# Patient Record
Sex: Female | Born: 2000 | Race: Black or African American | Hispanic: No | Marital: Single | Smoking: Never smoker
Health system: Southern US, Community
[De-identification: ages and names within clinical notes are randomized; demographics above are authoritative.]

## PROBLEM LIST (undated history)

## (undated) DIAGNOSIS — J45909 Unspecified asthma, uncomplicated: Secondary | ICD-10-CM

## (undated) HISTORY — PX: TONSILLECTOMY: SUR1361

## (undated) HISTORY — DX: Unspecified asthma, uncomplicated: J45.909

---
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
## 2020-04-16 ENCOUNTER — Encounter (HOSPITAL_COMMUNITY): Payer: Self-pay

## 2020-04-16 ENCOUNTER — Other Ambulatory Visit: Payer: Self-pay

## 2020-04-16 ENCOUNTER — Inpatient Hospital Stay (HOSPITAL_COMMUNITY)
Admission: EM | Admit: 2020-04-16 | Discharge: 2020-04-18 | DRG: 392 | Disposition: A | Discharge status: Home / Self Care | Payer: 59 | Attending: Student | Admitting: Student

## 2020-04-16 ENCOUNTER — Emergency Department (HOSPITAL_COMMUNITY): Payer: 59

## 2020-04-16 DIAGNOSIS — A084 Viral intestinal infection, unspecified: Secondary | ICD-10-CM | POA: Diagnosis present

## 2020-04-16 DIAGNOSIS — Z20822 Contact with and (suspected) exposure to covid-19: Secondary | ICD-10-CM | POA: Diagnosis present

## 2020-04-16 DIAGNOSIS — E872 Acidosis: Secondary | ICD-10-CM | POA: Diagnosis present

## 2020-04-16 DIAGNOSIS — E860 Dehydration: Secondary | ICD-10-CM | POA: Diagnosis present

## 2020-04-16 DIAGNOSIS — Z6836 Body mass index (BMI) 36.0-36.9, adult: Secondary | ICD-10-CM | POA: Diagnosis not present

## 2020-04-16 DIAGNOSIS — E871 Hypo-osmolality and hyponatremia: Secondary | ICD-10-CM | POA: Diagnosis present

## 2020-04-16 DIAGNOSIS — K521 Toxic gastroenteritis and colitis: Secondary | ICD-10-CM | POA: Diagnosis present

## 2020-04-16 DIAGNOSIS — I959 Hypotension, unspecified: Secondary | ICD-10-CM | POA: Diagnosis present

## 2020-04-16 DIAGNOSIS — K529 Noninfective gastroenteritis and colitis, unspecified: Secondary | ICD-10-CM | POA: Diagnosis not present

## 2020-04-16 DIAGNOSIS — N179 Acute kidney failure, unspecified: Secondary | ICD-10-CM | POA: Diagnosis present

## 2020-04-16 DIAGNOSIS — E669 Obesity, unspecified: Secondary | ICD-10-CM | POA: Diagnosis present

## 2020-04-16 DIAGNOSIS — J45909 Unspecified asthma, uncomplicated: Secondary | ICD-10-CM | POA: Diagnosis present

## 2020-04-16 DIAGNOSIS — E876 Hypokalemia: Secondary | ICD-10-CM | POA: Diagnosis present

## 2020-04-16 DIAGNOSIS — E861 Hypovolemia: Secondary | ICD-10-CM | POA: Diagnosis present

## 2020-04-16 DIAGNOSIS — R7989 Other specified abnormal findings of blood chemistry: Secondary | ICD-10-CM | POA: Diagnosis not present

## 2020-04-16 LAB — COMPREHENSIVE METABOLIC PANEL
ALT: 21 U/L (ref 0–44)
AST: 25 U/L (ref 15–41)
Albumin: 3.4 g/dL — ABNORMAL LOW (ref 3.5–5.0)
Alkaline Phosphatase: 87 U/L (ref 38–126)
Anion gap: 17 — ABNORMAL HIGH (ref 5–15)
BUN: 62 mg/dL — ABNORMAL HIGH (ref 6–20)
CO2: 17 mmol/L — ABNORMAL LOW (ref 22–32)
Calcium: 8.6 mg/dL — ABNORMAL LOW (ref 8.9–10.3)
Chloride: 98 mmol/L (ref 98–111)
Creatinine, Ser: 4.18 mg/dL — ABNORMAL HIGH (ref 0.44–1.00)
GFR, Estimated: 15 mL/min — ABNORMAL LOW (ref >60)
Glucose, Bld: 127 mg/dL — ABNORMAL HIGH (ref 70–99)
Potassium: 3 mmol/L — ABNORMAL LOW (ref 3.5–5.1)
Sodium: 132 mmol/L — ABNORMAL LOW (ref 135–145)
Total Bilirubin: 0.9 mg/dL (ref 0.3–1.2)
Total Protein: 8.6 g/dL — ABNORMAL HIGH (ref 6.5–8.1)

## 2020-04-16 LAB — CBC WITH DIFFERENTIAL/PLATELET
Abs Immature Granulocytes: 0.16 10*3/uL — ABNORMAL HIGH (ref 0.00–0.07)
Basophils Absolute: 0.1 10*3/uL (ref 0.0–0.1)
Basophils Relative: 1 %
Eosinophils Absolute: 0.1 10*3/uL (ref 0.0–0.5)
Eosinophils Relative: 1 %
HCT: 40.6 % (ref 36.0–46.0)
Hemoglobin: 13.4 g/dL (ref 12.0–15.0)
Immature Granulocytes: 1 %
Lymphocytes Relative: 3 %
Lymphs Abs: 0.6 10*3/uL — ABNORMAL LOW (ref 0.7–4.0)
MCH: 24.1 pg — ABNORMAL LOW (ref 26.0–34.0)
MCHC: 33 g/dL (ref 30.0–36.0)
MCV: 73.2 fL — ABNORMAL LOW (ref 80.0–100.0)
Monocytes Absolute: 0.6 10*3/uL (ref 0.1–1.0)
Monocytes Relative: 3 %
Neutro Abs: 21.9 10*3/uL — ABNORMAL HIGH (ref 1.7–7.7)
Neutrophils Relative %: 91 %
Platelets: 316 10*3/uL (ref 150–400)
RBC: 5.55 MIL/uL — ABNORMAL HIGH (ref 3.87–5.11)
RDW: 14.6 % (ref 11.5–15.5)
WBC: 23.5 10*3/uL — ABNORMAL HIGH (ref 4.0–10.5)
nRBC: 0 % (ref 0.0–0.2)

## 2020-04-16 LAB — LIPASE, BLOOD: Lipase: 34 U/L (ref 11–51)

## 2020-04-16 LAB — RESP PANEL BY RT-PCR (FLU A&B, COVID) ARPGX2
Influenza A by PCR: NEGATIVE
Influenza B by PCR: NEGATIVE
SARS Coronavirus 2 by RT PCR: NEGATIVE

## 2020-04-16 LAB — MAGNESIUM: Magnesium: 1.9 mg/dL (ref 1.7–2.4)

## 2020-04-16 LAB — LACTIC ACID, PLASMA: Lactic Acid, Venous: 1.6 mmol/L (ref 0.5–1.9)

## 2020-04-16 LAB — I-STAT BETA HCG BLOOD, ED (MC, WL, AP ONLY): I-stat hCG, quantitative: <5 m[IU]/mL (ref <5)

## 2020-04-16 LAB — CBG MONITORING, ED: Glucose-Capillary: 127 mg/dL — ABNORMAL HIGH (ref 70–99)

## 2020-04-16 MED ORDER — MORPHINE SULFATE (PF) 4 MG/ML IV SOLN
4.0000 mg | Freq: Once | INTRAVENOUS | Status: AC
  Administered 2020-04-16: 4 mg via INTRAVENOUS
  Filled 2020-04-16: qty 1

## 2020-04-16 MED ORDER — ACETAMINOPHEN 325 MG PO TABS
650.0000 mg | ORAL_TABLET | Freq: Once | ORAL | Status: AC
  Administered 2020-04-16: 650 mg via ORAL
  Filled 2020-04-16: qty 2

## 2020-04-16 MED ORDER — POTASSIUM CHLORIDE 2 MEQ/ML IV SOLN
INTRAVENOUS | Status: DC
  Filled 2020-04-16 (×6): qty 1000

## 2020-04-16 MED ORDER — LACTATED RINGERS IV SOLN: INTRAVENOUS | Status: AC

## 2020-04-16 MED ORDER — HYDROCODONE-ACETAMINOPHEN 5-325 MG PO TABS
1.0000 | ORAL_TABLET | Freq: Four times a day (QID) | ORAL | Status: DC | PRN
  Administered 2020-04-17 – 2020-04-18 (×3): 1 via ORAL
  Filled 2020-04-16 (×3): qty 1

## 2020-04-16 MED ORDER — LACTATED RINGERS IV SOLN: INTRAVENOUS | Status: DC

## 2020-04-16 MED ORDER — LACTATED RINGERS IV BOLUS (SEPSIS)
1000.0000 mL | Freq: Once | INTRAVENOUS | Status: AC
  Administered 2020-04-16: 1000 mL via INTRAVENOUS

## 2020-04-16 MED ORDER — ONDANSETRON HCL 4 MG/2ML IJ SOLN
4.0000 mg | Freq: Four times a day (QID) | INTRAMUSCULAR | Status: DC | PRN
  Filled 2020-04-16: qty 2

## 2020-04-16 MED ORDER — LACTATED RINGERS IV BOLUS (SEPSIS)
600.0000 mL | Freq: Once | INTRAVENOUS | Status: AC
  Administered 2020-04-16: 600 mL via INTRAVENOUS

## 2020-04-16 MED ORDER — ACETAMINOPHEN 650 MG RE SUPP: 650.0000 mg | Freq: Four times a day (QID) | RECTAL | Status: DC | PRN

## 2020-04-16 MED ORDER — ONDANSETRON HCL 4 MG PO TABS: 4.0000 mg | ORAL_TABLET | Freq: Four times a day (QID) | ORAL | Status: DC | PRN

## 2020-04-16 MED ORDER — SODIUM CHLORIDE 0.9 % IV SOLN
2.0000 g | Freq: Once | INTRAVENOUS | Status: AC
  Administered 2020-04-16: 2 g via INTRAVENOUS
  Filled 2020-04-16: qty 2

## 2020-04-16 MED ORDER — ONDANSETRON HCL 4 MG/2ML IJ SOLN
4.0000 mg | Freq: Once | INTRAMUSCULAR | Status: AC
  Administered 2020-04-16: 4 mg via INTRAVENOUS
  Filled 2020-04-16: qty 2

## 2020-04-16 MED ORDER — POTASSIUM CHLORIDE 10 MEQ/100ML IV SOLN
10.0000 meq | Freq: Once | INTRAVENOUS | Status: AC
  Administered 2020-04-16: 10 meq via INTRAVENOUS
  Filled 2020-04-16: qty 100

## 2020-04-16 MED ORDER — MORPHINE SULFATE (PF) 2 MG/ML IV SOLN: 1.0000 mg | INTRAVENOUS | Status: DC | PRN

## 2020-04-16 MED ORDER — LACTATED RINGERS IV BOLUS
2000.0000 mL | Freq: Once | INTRAVENOUS | Status: AC
  Administered 2020-04-16: 2000 mL via INTRAVENOUS

## 2020-04-16 MED ORDER — METRONIDAZOLE IN NACL 5-0.79 MG/ML-% IV SOLN
500.0000 mg | Freq: Once | INTRAVENOUS | Status: AC
  Administered 2020-04-16: 500 mg via INTRAVENOUS
  Filled 2020-04-16: qty 100

## 2020-04-16 MED ORDER — ACETAMINOPHEN 325 MG PO TABS: 650.0000 mg | ORAL_TABLET | Freq: Four times a day (QID) | ORAL | Status: DC | PRN

## 2020-04-16 NOTE — ED Triage Notes (Signed)
Pt brought in for n/v/d & abd pain x 5 days. States she ate something from [employer] on Thursday and s/s started after. Pt was tachy in 120s for EMS, when pt arrived and stood up to get in chair HR went up to 160s.

## 2020-04-16 NOTE — ED Notes (Signed)
Secure message sent to [facility]

## 2020-04-16 NOTE — ED Notes (Signed)
Called transport to take patient upstiars 

## 2020-04-16 NOTE — Progress Notes (Signed)
A consult was received from an ED physician for cefepime per pharmacy dosing (for an indication other than meningitis). The patient's profile has been reviewed for ht/wt/allergies/indication/available labs. A one time order has been placed for the above antibiotics.  Further antibiotics/pharmacy consults should be ordered by admitting physician if indicated.                       Bernadene Person, PharmD, BCPS [phone] 04/16/2020, 3:44 PM

## 2020-04-16 NOTE — ED Provider Notes (Signed)
[facility]-EMERGENCY DEPT Provider Note   CSN: [id number] Arrival date & time: 04/16/20  1345     History Chief Complaint  Patient presents with  . Emesis    Abigail Grant is a 20 y.o. female.  20 year old female presents with several days of nausea vomiting diarrhea.  Patient states that she thinks she ate bad food.  Has had temperature times several days.  Had abdominal cramping which is worse in her lower quadrants.  Denies any vaginal bleeding or discharge.  Slight cough without congestion.  Has tried over-the-counter medications without relief.        Past Medical History:  Diagnosis Date  . Asthma     Patient Active Problem List   Diagnosis Date Noted  . Asthma 12/08/2011  . AR (allergic rhinitis) 12/08/2011    Past Surgical History:  Procedure Laterality Date  . TONSILLECTOMY       OB History   No obstetric history on file.     No family history on file.  Social History   Tobacco Use  . Smoking status: Never Smoker  . Smokeless tobacco: Never Used  Vaping Use  . Vaping Use: Never used  Substance Use Topics  . Alcohol use: Yes    Comment: occ  . Drug use: No    Home Medications Prior to Admission medications   Medication Sig Start Date End Date Taking? Authorizing Provider  Misc Natural Products (SUPER GREENS) POWD Take 1 Scoop by mouth daily.   Yes [provider]  Multiple Vitamins-Minerals (MULTIPLE VITAMINS/WOMENS PO) Take 1 tablet by mouth daily.   Yes [provider]    Allergies    Patient has no known allergies.  Review of Systems   Review of Systems  All other systems reviewed and are negative.   Physical Exam Updated Vital Signs BP 104/89 (BP Location: Left Arm)   Pulse (!) 154   Temp (!) 101.3 F (38.5 C) (Oral)   Resp (!) 30   Ht 1.575 m (5\' 2" )   Wt 90.7 kg   LMP 04/02/2020   SpO2 98%   BMI 36.58 kg/m   Physical Exam Vitals and nursing note reviewed.  Constitutional:       General: She is not in acute distress.    Appearance: Normal appearance. She is well-developed. She is not toxic-appearing.  HENT:     Head: Normocephalic and atraumatic.  Eyes:     General: Lids are normal.     Conjunctiva/sclera: Conjunctivae normal.     Pupils: Pupils are equal, round, and reactive to light.  Neck:     Thyroid: No thyroid mass.     Trachea: No tracheal deviation.  Cardiovascular:     Rate and Rhythm: Regular rhythm. Tachycardia present.     Heart sounds: Normal heart sounds. No murmur heard. No gallop.   Pulmonary:     Effort: Pulmonary effort is normal. No respiratory distress.     Breath sounds: Normal breath sounds. No stridor. No decreased breath sounds, wheezing, rhonchi or rales.  Abdominal:     General: Bowel sounds are normal. There is no distension.     Palpations: Abdomen is soft.     Tenderness: There is abdominal tenderness in the right lower quadrant and left lower quadrant. There is guarding. There is no rebound.    Musculoskeletal:        General: No tenderness. Normal range of motion.     Cervical back: Normal range of motion and neck  supple.  Skin:    General: Skin is warm and dry.     Findings: No abrasion or rash.  Neurological:     Mental Status: She is alert and oriented to person, place, and time.     GCS: GCS eye subscore is 4. GCS verbal subscore is 5. GCS motor subscore is 6.     Cranial Nerves: No cranial nerve deficit.     Sensory: No sensory deficit.  Psychiatric:        Speech: Speech normal.        Behavior: Behavior normal.     ED Results / Procedures / Treatments   Labs (all labs ordered are listed, but only abnormal results are displayed) Labs Reviewed  CBG MONITORING, ED - Abnormal; Notable for the following components:      Result Value   Glucose-Capillary 127 (*)    All other components within normal limits  RESP PANEL BY RT-PCR (FLU A&B, COVID) ARPGX2  CULTURE, BLOOD (ROUTINE X 2)  CULTURE, BLOOD (ROUTINE  X 2)  CBC WITH DIFFERENTIAL/PLATELET  COMPREHENSIVE METABOLIC PANEL  RAPID URINE DRUG SCREEN, HOSP PERFORMED  LIPASE, BLOOD  LACTIC ACID, PLASMA  I-STAT BETA HCG BLOOD, ED ([facility], [facility], [facility] ONLY)    EKG None  Radiology No results found.  Procedures Procedures   Medications Ordered in ED Medications  lactated ringers infusion (has no administration in time range)  lactated ringers bolus 2,000 mL (has no administration in time range)  ondansetron (ZOFRAN) injection 4 mg (has no administration in time range)    ED Course  I have reviewed the triage vital signs and the nursing notes.  Pertinent labs & imaging results that were available during my care of the patient were reviewed by me and considered in my medical decision making (see chart for details).    MDM Rules/Calculators/A&P                          Sepsis protocol started Patient's Covid test negative here.  Lactate normal.  Patient started on IV antibiotics.  Heart rate improved.  Has evidence of acute kidney injury.  Will require admission.  Care signed over to Dr. Delford Field  CRITICAL CARE Performed by: Toy Baker Total critical care time: 50 minutes Critical care time was exclusive of separately billable procedures and treating other patients. Critical care was necessary to treat or prevent imminent or life-threatening deterioration. Critical care was time spent personally by me on the following activities: development of treatment plan with patient and/or surrogate as well as nursing, discussions with consultants, evaluation of patient's response to treatment, examination of patient, obtaining history from patient or surrogate, ordering and performing treatments and interventions, ordering and review of laboratory studies, ordering and review of radiographic studies, pulse oximetry and re-evaluation of patient's condition. Final Clinical Impression(s) / ED Diagnoses Final diagnoses:  None    Rx / DC Orders ED  Discharge Orders    None       Lorre Nick, MD 04/16/20 1700

## 2020-04-16 NOTE — ED Provider Notes (Signed)
  Physical Exam  BP 121/60   Pulse (!) 112   Temp (!) 101.9 F (38.8 C) (Oral)   Resp (!) 24   Ht 5\' 2"  (1.575 m)   Wt 90.7 kg   LMP 04/02/2020   SpO2 99%   BMI 36.58 kg/m   Physical Exam  ED Course/Procedures     Procedures  MDM  Patient awaiting CT read at signout.  She presented with nausea, vomiting, and diarrhea.  She was noted to be quite tachycardic and hypotensive during her ED course.  She received IV hydration with good response.  She also received empiric antibiotics for a potential GI source.  CT scan ultimately showed diffuse enterocolitis without surgical pathology.  She will be admitted for further treatment given that she has acute kidney injury and some electrolyte derangements.  I did speak with Dr. 04/04/2020.       Natale Milch, MD 04/16/20 [phone]

## 2020-04-16 NOTE — Progress Notes (Signed)
elink tracking sepsis 

## 2020-04-16 NOTE — H&P (Addendum)
History and Physical    Abigail Grant ZOX:[id number] DOB: [birth year] DOA: 04/16/2020  PCP: Patient, No Pcp Per   Chief Complaint: Intractable nausea vomiting diarrhea abdominal pain  HPI: Abigail Grant is a 20 y.o. female with no real medical history other than asthma.  Patient presents to the ED after 48 to 72 hours of intractable nausea vomiting diarrhea and abdominal pain.  Patient is concerned she ate "bad food" and does have known sick contacts who shared food with her.  In the ED patient had notable lab abnormalities, tachycardia with imaging consistent with enteritis.  Given profound dehydration, inability to tolerate p.o. intake appropriately and electrolyte abnormalities will admit to hospital for further evaluation and care.  Review of Systems: As per HPI including intractable nausea vomiting diarrhea and abdominal pain, crampy in nature over the past 40 to 72 hours prior to admission.  Denies headache, chest pain, shortness of breath, vaginal bleeding or discharge, dysuria or hematuria.   Assessment/Plan Active Problems:   Gastroenteritis due to food toxin    Acute onset intractable nausea vomiting diarrhea in the setting of sepsis enterocolitis, cannot rule out foodborne illness -Patient admitted with tachycardia, leukocytosis and CT imaging remarkable for enterocolitis, given history patient is high risk for foodborne illness and infection. -Hold off on antibiotics given unknown infection type, viral versus bacterial -Continue supportive care, IV fluids, clear liquid diet -Zofran for nausea, acetaminophen, hydrocodone, morphine for intractable pain -Trend labs, cultures pending  Profound AKI secondary to dehydration in the setting of above -No known history of kidney disease, creatinine markedly elevated above 4, presuming normal creatinine given patient's age and lack of medical history -Continue IV fluids with LR and 40 of K given profound hypokalemia at 100 cc/h -Continue  clear liquid diet, will advance slowly over the next 48 hours as tolerated -Discontinue IV fluids once p.o. intake is appropriate to offset GI losses  Electrolyte abnormalities secondary to above Concurrent anion gap metabolic acidosis in setting of GI losses -without lactic acidosis -Hypovolemic hyponatremia, hypokalemia in setting of GI losses -Continue LR as above  DVT prophylaxis: Early ambulation SCDs only Code Status: Full Family Communication: Mother at bedside Status is: Inpatient  Dispo: The patient is from: Home              Anticipated d/c is to: Home              Anticipated d/c date is: 48 to 72 hours pending clinical course              Patient currently not medically stable for discharge due to profound electrolyte abnormalities dehydration poor p.o. intake and the need for IV fluids IV electrolyte replacement and IV narcotics.  Consultants:   None  Procedures:   None indicated   Past Medical History:  Diagnosis Date  . Asthma     Past Surgical History:  Procedure Laterality Date  . TONSILLECTOMY       reports that she has never smoked. She has never used smokeless tobacco. She reports current alcohol use. She reports that she does not use drugs.  No Known Allergies  No family history on file.  Prior to Admission medications   Medication Sig Start Date End Date Taking? Authorizing Provider  Misc Natural Products (SUPER GREENS) POWD Take 1 Scoop by mouth daily.   Yes [provider]  Multiple Vitamins-Minerals (MULTIPLE VITAMINS/WOMENS PO) Take 1 tablet by mouth daily.   Yes [provider]    Physical Exam: Vitals:  04/16/20 1600 04/16/20 1637 04/16/20 1730 04/16/20 1731  BP: (!) 110/55 (!) 83/54  121/60  Pulse: (!) 123 (!) 118 (!) 114 (!) 114  Resp: (!) 24 14 (!) 28 (!) 22  Temp:      TempSrc:      SpO2: 99% 100% 100% 99%  Weight:      Height:        Constitutional: NAD, calm, comfortable Vitals:   04/16/20 1600  04/16/20 1637 04/16/20 1730 04/16/20 1731  BP: (!) 110/55 (!) 83/54  121/60  Pulse: (!) 123 (!) 118 (!) 114 (!) 114  Resp: (!) 24 14 (!) 28 (!) 22  Temp:      TempSrc:      SpO2: 99% 100% 100% 99%  Weight:      Height:       General:  Pleasantly resting in bed, No acute distress. HEENT:  Normocephalic atraumatic.  Sclerae nonicteric, noninjected.  Extraocular movements intact bilaterally. Neck:  Without mass or deformity.  Trachea is midline. Lungs:  Clear to auscultate bilaterally without rhonchi, wheeze, or rales. Heart:  Regular rate and rhythm.  Without murmurs, rubs, or gallops. Abdomen:  Soft, minimally tender diffusely, nondistended.  Without guarding or rebound. Extremities: Without cyanosis, clubbing, edema, or obvious deformity. Vascular:  Dorsalis pedis and posterior tibial pulses palpable bilaterally. Skin:  Warm and dry, no erythema, no ulcerations.  Labs on Admission: I have personally reviewed following labs and imaging studies  CBC: Recent Labs  Lab 04/16/20 1432  WBC 23.5*  NEUTROABS 21.9*  HGB 13.4  HCT 40.6  MCV 73.2*  PLT 316   Basic Metabolic Panel: Recent Labs  Lab 04/16/20 1432  NA 132*  K 3.0*  CL 98  CO2 17*  GLUCOSE 127*  BUN 62*  CREATININE 4.18*  CALCIUM 8.6*   GFR: Estimated Creatinine Clearance: 22.3 mL/min (A) (by C-G formula based on SCr of 4.18 mg/dL (H)). Liver Function Tests: Recent Labs  Lab 04/16/20 1432  AST 25  ALT 21  ALKPHOS 87  BILITOT 0.9  PROT 8.6*  ALBUMIN 3.4*   Recent Labs  Lab 04/16/20 1432  LIPASE 34   No results for input(s): AMMONIA in the last 168 hours. Coagulation Profile: No results for input(s): INR, PROTIME in the last 168 hours. Cardiac Enzymes: No results for input(s): CKTOTAL, CKMB, CKMBINDEX, TROPONINI in the last 168 hours. BNP (last 3 results) No results for input(s): PROBNP in the last 8760 hours. HbA1C: No results for input(s): HGBA1C in the last 72 hours. CBG: Recent Labs  Lab  04/16/20 1400  GLUCAP 127*   Lipid Profile: No results for input(s): CHOL, HDL, LDLCALC, TRIG, CHOLHDL, LDLDIRECT in the last 72 hours. Thyroid Function Tests: No results for input(s): TSH, T4TOTAL, FREET4, T3FREE, THYROIDAB in the last 72 hours. Anemia Panel: No results for input(s): VITAMINB12, FOLATE, FERRITIN, TIBC, IRON, RETICCTPCT in the last 72 hours. Urine analysis:    Component Value Date/Time   BILIRUBINUR neg 05/04/2013 1852   PROTEINUR 100 05/04/2013 1852   UROBILINOGEN 0.2 05/04/2013 1852   NITRITE neg 05/04/2013 1852   LEUKOCYTESUR Trace 05/04/2013 1852    Radiological Exams on Admission: CT Abdomen Pelvis Wo Contrast  Result Date: 04/16/2020 CLINICAL DATA:  Nausea, vomiting and diarrhea with abdominal pain for 5 days. EXAM: CT ABDOMEN AND PELVIS WITHOUT CONTRAST TECHNIQUE: Multidetector CT imaging of the abdomen and pelvis was performed following the standard protocol without IV contrast. COMPARISON:  None. FINDINGS: Lower chest: Dependent atelectatic changes. Hepatobiliary: No  worrisome focal liver lesions. Smooth liver surface contour. Normal hepatic attenuation. Normal gallbladder and biliary tree. Pancreas: No pancreatic ductal dilatation or surrounding inflammatory changes. Spleen: Normal in size. No concerning splenic lesions. Adrenals/Urinary Tract: Normal adrenal glands. Kidneys symmetric in size and normally located. No visible or contour deforming renal lesions. No urolithiasis or hydronephrosis. Urinary bladder is largely decompressed at the time of exam and therefore poorly evaluated by CT imaging. Stomach/Bowel: Distal esophagus unremarkable. Stomach distended with fluid and ingested material. Duodenum with a normal sweep across the midline abdomen. Much of the small bowel and colon appears largely fluid-filled and mildly thickened albeit without frank distention or an abrupt transition point to suggest high-grade obstruction. Hazy perienteric edematous changes with  reactive adenopathy throughout the mesentery. Lack of formed stool through the colon suggestive of a rapid transit state. Vascular/Lymphatic: No significant vascular findings. Extensive reactive appearing adenopathy in the mid mesentery. No pathologically enlarged nodes within limitations of this unenhanced CT. Reproductive: Uterus and bilateral adnexa are unremarkable. Other: Hazy edematous changes throughout the mesentery. Trace low-attenuation free fluid in the deep pelvis, favor reactive or physiologic. No free air. No bowel containing hernia. Metallic ornamentation of the umbilicus. Tiny fat containing umbilical hernia. Musculoskeletal: No acute or significant osseous findings. IMPRESSION: 1. Much of the small bowel and colon appears largely fluid-filled and mildly thickened albeit without frank distention or an abrupt transition point to suggest high-grade obstruction. Lack of formed stool through the colon suggestive of a rapid transit state. Additional hazy perienteric edematous changes with reactive adenopathy throughout the mesentery. Findings are suggestive of nonspecific enterocolitis. 2. Trace low-attenuation free fluid in the deep pelvis, favor reactive or physiologic in a reproductive age female. Electronically Signed   By: Kreg Shropshire M.D.   On: 04/16/2020 17:01   DG Chest Port 1 View  Result Date: 04/16/2020 CLINICAL DATA:  Abdominal pain. EXAM: PORTABLE CHEST 1 VIEW COMPARISON:  None. FINDINGS: The heart size and mediastinal contours are within normal limits. Both lungs are clear. The visualized skeletal structures are unremarkable. IMPRESSION: No active disease. Electronically Signed   By: Lupita Raider M.D.   On: 04/16/2020 15:40    EKG: Independently reviewed.  Sinus tachycardia   Azucena Fallen DO [facility] For contact please use secure messenger on Epic  If 7PM-7AM, please contact night-coverage located on [email or web address]   04/16/2020, 5:43 PM

## 2020-04-17 ENCOUNTER — Encounter (HOSPITAL_COMMUNITY): Payer: Self-pay | Admitting: Internal Medicine

## 2020-04-17 ENCOUNTER — Other Ambulatory Visit: Payer: Self-pay

## 2020-04-17 LAB — CBC
HCT: 31.8 % — ABNORMAL LOW (ref 36.0–46.0)
Hemoglobin: 10.7 g/dL — ABNORMAL LOW (ref 12.0–15.0)
MCH: 24.2 pg — ABNORMAL LOW (ref 26.0–34.0)
MCHC: 33.6 g/dL (ref 30.0–36.0)
MCV: 71.9 fL — ABNORMAL LOW (ref 80.0–100.0)
Platelets: 244 10*3/uL (ref 150–400)
RBC: 4.42 MIL/uL (ref 3.87–5.11)
RDW: 14.6 % (ref 11.5–15.5)
WBC: 12.4 10*3/uL — ABNORMAL HIGH (ref 4.0–10.5)
nRBC: 0 % (ref 0.0–0.2)

## 2020-04-17 LAB — COMPREHENSIVE METABOLIC PANEL
ALT: 18 U/L (ref 0–44)
AST: 20 U/L (ref 15–41)
Albumin: 2.6 g/dL — ABNORMAL LOW (ref 3.5–5.0)
Alkaline Phosphatase: 57 U/L (ref 38–126)
Anion gap: 10 (ref 5–15)
BUN: 38 mg/dL — ABNORMAL HIGH (ref 6–20)
CO2: 20 mmol/L — ABNORMAL LOW (ref 22–32)
Calcium: 8 mg/dL — ABNORMAL LOW (ref 8.9–10.3)
Chloride: 104 mmol/L (ref 98–111)
Creatinine, Ser: 2 mg/dL — ABNORMAL HIGH (ref 0.44–1.00)
GFR, Estimated: 36 mL/min — ABNORMAL LOW (ref >60)
Glucose, Bld: 116 mg/dL — ABNORMAL HIGH (ref 70–99)
Potassium: 3 mmol/L — ABNORMAL LOW (ref 3.5–5.1)
Sodium: 134 mmol/L — ABNORMAL LOW (ref 135–145)
Total Bilirubin: 0.7 mg/dL (ref 0.3–1.2)
Total Protein: 6.8 g/dL (ref 6.5–8.1)

## 2020-04-17 LAB — C DIFFICILE QUICK SCREEN W PCR REFLEX
C Diff antigen: NEGATIVE
C Diff interpretation: NOT DETECTED
C Diff toxin: NEGATIVE

## 2020-04-17 LAB — HIV ANTIBODY (ROUTINE TESTING W REFLEX): HIV Screen 4th Generation wRfx: NONREACTIVE

## 2020-04-17 MED ORDER — GUAIFENESIN-DM 100-10 MG/5ML PO SYRP
5.0000 mL | ORAL_SOLUTION | ORAL | Status: DC | PRN
  Administered 2020-04-17 – 2020-04-18 (×3): 5 mL via ORAL
  Filled 2020-04-17 (×3): qty 10

## 2020-04-17 MED ORDER — POTASSIUM CHLORIDE CRYS ER 20 MEQ PO TBCR
40.0000 meq | EXTENDED_RELEASE_TABLET | ORAL | Status: AC
  Administered 2020-04-17: 40 meq via ORAL
  Filled 2020-04-17 (×2): qty 2

## 2020-04-17 MED ORDER — ALUM & MAG HYDROXIDE-SIMETH 200-200-20 MG/5ML PO SUSP
15.0000 mL | Freq: Four times a day (QID) | ORAL | Status: DC | PRN
  Administered 2020-04-17 – 2020-04-18 (×3): 15 mL via ORAL
  Filled 2020-04-17 (×4): qty 30

## 2020-04-17 NOTE — Progress Notes (Signed)
PROGRESS NOTE    Abigail Grant  VHQ:[id number] DOB: 29-Aug-2000 DOA: 04/16/2020 PCP: Patient, No Pcp Per   Brief Narrative:  Abigail Grant is a 20 y.o. female with no real medical history other than asthma.  Patient presents to the ED after 48 to 72 hours of intractable nausea vomiting diarrhea and abdominal pain.  Patient is concerned she ate "bad food" and does have known sick contacts who shared food with her.  In the ED patient had notable lab abnormalities, tachycardia with imaging consistent with enteritis.  Given profound dehydration, inability to tolerate p.o. intake appropriately and electrolyte abnormalities will admit to hospital for further evaluation and care.   Assessment & Plan:   Active Problems:   Gastroenteritis due to food toxin   Acute onset intractable nausea vomiting diarrhea in the setting of sepsis enterocolitis, cannot rule out foodborne illness, improving -P.o. intake continues to be somewhat limited today, will likely need an additional 24 to 48 hours of IV fluids and limited diet until symptoms have resolved. -Patient admitted with tachycardia, leukocytosis and CT imaging remarkable for enterocolitis, given history patient is high risk for foodborne illness and infection. -Hold off on antibiotics given unknown infection type, viral versus bacterial -Continue supportive care, IV fluids, clear liquid diet; zofran for nausea, acetaminophen, hydrocodone, morphine for intractable pain -Trend labs, cultures  preliminary negative - both blood and stool cultures  Profound AKI secondary to dehydration in the setting of above -Creatinine downtrending but not yet back to presumed baseline -Continue IV fluids given poor p.o. intake; continue clears today, discussed advancing diet once clears were tolerated without any further nausea or vomiting -No known history of kidney disease, creatinine markedly elevated above 4, presuming normal creatinine given patient's age and  lack of medical history  Electrolyte abnormalities secondary to above Concurrent anion gap metabolic acidosis in setting of GI losses -without lactic acidosis -Hypovolemic hyponatremia, hypokalemia in setting of GI losses -Continue LR as above  DVT prophylaxis: Early ambulation SCDs only Code Status: Full Family Communication: None present  Status is: Inpatient  Dispo: The patient is from: Home              Anticipated d/c is to: Home              Anticipated d/c date is: 24 to 48 hours              Patient currently not medically stable for discharge given ongoing need for IV fluids, poor p.o. intake, ongoing vomiting and diarrhea.  Consultants:   None  Procedures:   None  Antimicrobials:  None  Subjective: Single episode of emesis overnight/early this morning, diarrhea ongoing somewhat profound and watery per the patient.  Otherwise denies headache fevers chills chest pain shortness of breath.  Patient continues to have some anxiety with p.o. intake due to previous profound symptoms as such we will continue on IV fluids as above.  Objective: Vitals:   04/16/20 1914 04/16/20 2115 04/17/20 0111 04/17/20 0528  BP: 122/66 (!) 106/56 113/66 (!) 114/94  Pulse: (!) 126 (!) 109 (!) 111 (!) 104  Resp: (!) 24 (!) 22 (!) 24 (!) 21  Temp: 98.7 F (37.1 C) 98.9 F (37.2 C) 98.8 F (37.1 C) 98.8 F (37.1 C)  TempSrc: Oral Oral Oral Oral  SpO2: 97% 98% 96% 97%  Weight:      Height:        Intake/Output Summary (Last 24 hours) at 04/17/2020 4132 Last data filed at 04/16/2020 4401  Gross per 24 hour  Intake 4077.5 ml  Output --  Net 4077.5 ml   Filed Weights   04/16/20 1404  Weight: 90.7 kg    Examination:  General:  Pleasantly resting in bed, No acute distress. HEENT:  Normocephalic atraumatic.  Sclerae nonicteric, noninjected.  Extraocular movements intact bilaterally. Neck:  Without mass or deformity.  Trachea is midline. Lungs:  Clear to auscultate bilaterally  without rhonchi, wheeze, or rales. Heart:  Regular rate and rhythm.  Without murmurs, rubs, or gallops. Abdomen:  Soft, nontender, nondistended.  Without guarding or rebound. Extremities: Without cyanosis, clubbing, edema, or obvious deformity. Vascular:  Dorsalis pedis and posterior tibial pulses palpable bilaterally. Skin:  Warm and dry, no erythema, no ulcerations.  Data Reviewed: I have personally reviewed following labs and imaging studies  CBC: Recent Labs  Lab 04/16/20 1432 04/17/20 0343  WBC 23.5* 12.4*  NEUTROABS 21.9*  --   HGB 13.4 10.7*  HCT 40.6 31.8*  MCV 73.2* 71.9*  PLT 316 244   Basic Metabolic Panel: Recent Labs  Lab 04/16/20 1432 04/16/20 2123 04/17/20 0343  NA 132*  --  134*  K 3.0*  --  3.0*  CL 98  --  104  CO2 17*  --  20*  GLUCOSE 127*  --  116*  BUN 62*  --  38*  CREATININE 4.18*  --  2.00*  CALCIUM 8.6*  --  8.0*  MG  --  1.9  --    GFR: Estimated Creatinine Clearance: 46.6 mL/min (A) (by C-G formula based on SCr of 2 mg/dL (H)). Liver Function Tests: Recent Labs  Lab 04/16/20 1432 04/17/20 0343  AST 25 20  ALT 21 18  ALKPHOS 87 57  BILITOT 0.9 0.7  PROT 8.6* 6.8  ALBUMIN 3.4* 2.6*   Recent Labs  Lab 04/16/20 1432  LIPASE 34   No results for input(s): AMMONIA in the last 168 hours. Coagulation Profile: No results for input(s): INR, PROTIME in the last 168 hours. Cardiac Enzymes: No results for input(s): CKTOTAL, CKMB, CKMBINDEX, TROPONINI in the last 168 hours. BNP (last 3 results) No results for input(s): PROBNP in the last 8760 hours. HbA1C: No results for input(s): HGBA1C in the last 72 hours. CBG: Recent Labs  Lab 04/16/20 1400  GLUCAP 127*   Lipid Profile: No results for input(s): CHOL, HDL, LDLCALC, TRIG, CHOLHDL, LDLDIRECT in the last 72 hours. Thyroid Function Tests: No results for input(s): TSH, T4TOTAL, FREET4, T3FREE, THYROIDAB in the last 72 hours. Anemia Panel: No results for input(s): VITAMINB12,  FOLATE, FERRITIN, TIBC, IRON, RETICCTPCT in the last 72 hours. Sepsis Labs: Recent Labs  Lab 04/16/20 1440  LATICACIDVEN 1.6    Recent Results (from the past 240 hour(s))  Culture, blood (Routine X 2) w Reflex to ID Panel     Status: None (Preliminary result)   Collection Time: 04/16/20  2:40 PM   Specimen: BLOOD  Result Value Ref Range Status   Specimen Description   Final    BLOOD LEFT ANTECUBITAL Performed at [facility], [address] [place], [place] [address]    Special Requests   Final    BOTTLES DRAWN AEROBIC ONLY Blood Culture adequate volume Performed at [facility], [address] [place], [place] [address]    Culture PENDING  Incomplete   Report Status PENDING  Incomplete  Resp Panel by RT-PCR (Flu A&B, Covid) Nasopharyngeal Swab     Status: None   Collection Time: 04/16/20  2:50  PM   Specimen: Nasopharyngeal Swab; Nasopharyngeal(NP) swabs in vial transport medium  Result Value Ref Range Status   SARS Coronavirus 2 by RT PCR NEGATIVE NEGATIVE Final    Comment: (NOTE) SARS-CoV-2 target nucleic acids are NOT DETECTED.  The SARS-CoV-2 RNA is generally detectable in upper respiratory specimens during the acute phase of infection. The lowest concentration of SARS-CoV-2 viral copies this assay can detect is 138 copies/mL. A negative result does not preclude SARS-Cov-2 infection and should not be used as the sole basis for treatment or other patient management decisions. A negative result may occur with  improper specimen collection/handling, submission of specimen other than nasopharyngeal swab, presence of viral mutation(s) within the areas targeted by this assay, and inadequate number of viral copies(<138 copies/mL). A negative result must be combined with clinical observations, patient history, and epidemiological information. The expected result is Negative.  Fact Sheet for Patients:   BloggerCourse.com  Fact Sheet for Healthcare Providers:  SeriousBroker.it  This test is no t yet approved or cleared by the [place] FDA and  has been authorized for detection and/or diagnosis of SARS-CoV-2 by FDA under an Emergency Use Authorization (EUA). This EUA will remain  in effect (meaning this test can be used) for the duration of the COVID-19 declaration under Section 564(b)(1) of the Act, 21 U.S.C.section 360bbb-3(b)(1), unless the authorization is terminated  or revoked sooner.       Influenza A by PCR NEGATIVE NEGATIVE Final   Influenza B by PCR NEGATIVE NEGATIVE Final    Comment: (NOTE) The Xpert Xpress SARS-CoV-2/FLU/RSV plus assay is intended as an aid in the diagnosis of influenza from Nasopharyngeal swab specimens and should not be used as a sole basis for treatment. Nasal washings and aspirates are unacceptable for Xpert Xpress SARS-CoV-2/FLU/RSV testing.  Fact Sheet for Patients: BloggerCourse.com  Fact Sheet for Healthcare Providers: SeriousBroker.it  This test is not yet approved or cleared by the [place] FDA and has been authorized for detection and/or diagnosis of SARS-CoV-2 by FDA under an Emergency Use Authorization (EUA). This EUA will remain in effect (meaning this test can be used) for the duration of the COVID-19 declaration under Section 564(b)(1) of the Act, 21 U.S.C. section 360bbb-3(b)(1), unless the authorization is terminated or revoked.  Performed at [facility], [address], [place], [place] [address]   C Difficile Quick Screen w PCR reflex     Status: None   Collection Time: 04/17/20  3:00 AM   Specimen: STOOL  Result Value Ref Range Status   C Diff antigen NEGATIVE NEGATIVE Final   C Diff toxin NEGATIVE NEGATIVE Final   C Diff interpretation No C. difficile detected.  Final    Comment: Performed at  [facility], [address], [place], [place] [address]     Radiology Studies: CT Abdomen Pelvis Wo Contrast  Result Date: 04/16/2020 CLINICAL DATA:  Nausea, vomiting and diarrhea with abdominal pain for 5 days. EXAM: CT ABDOMEN AND PELVIS WITHOUT CONTRAST TECHNIQUE: Multidetector CT imaging of the abdomen and pelvis was performed following the standard protocol without IV contrast. COMPARISON:  None. FINDINGS: Lower chest: Dependent atelectatic changes. Hepatobiliary: No worrisome focal liver lesions. Smooth liver surface contour. Normal hepatic attenuation. Normal gallbladder and biliary tree. Pancreas: No pancreatic ductal dilatation or surrounding inflammatory changes. Spleen: Normal in size. No concerning splenic lesions. Adrenals/Urinary Tract: Normal adrenal glands. Kidneys symmetric in size and normally located. No visible or contour deforming renal lesions. No urolithiasis or hydronephrosis.  Urinary bladder is largely decompressed at the time of exam and therefore poorly evaluated by CT imaging. Stomach/Bowel: Distal esophagus unremarkable. Stomach distended with fluid and ingested material. Duodenum with a normal sweep across the midline abdomen. Much of the small bowel and colon appears largely fluid-filled and mildly thickened albeit without frank distention or an abrupt transition point to suggest high-grade obstruction. Hazy perienteric edematous changes with reactive adenopathy throughout the mesentery. Lack of formed stool through the colon suggestive of a rapid transit state. Vascular/Lymphatic: No significant vascular findings. Extensive reactive appearing adenopathy in the mid mesentery. No pathologically enlarged nodes within limitations of this unenhanced CT. Reproductive: Uterus and bilateral adnexa are unremarkable. Other: Hazy edematous changes throughout the mesentery. Trace low-attenuation free fluid in the deep pelvis, favor reactive or physiologic. No free air.  No bowel containing hernia. Metallic ornamentation of the umbilicus. Tiny fat containing umbilical hernia. Musculoskeletal: No acute or significant osseous findings. IMPRESSION: 1. Much of the small bowel and colon appears largely fluid-filled and mildly thickened albeit without frank distention or an abrupt transition point to suggest high-grade obstruction. Lack of formed stool through the colon suggestive of a rapid transit state. Additional hazy perienteric edematous changes with reactive adenopathy throughout the mesentery. Findings are suggestive of nonspecific enterocolitis. 2. Trace low-attenuation free fluid in the deep pelvis, favor reactive or physiologic in a reproductive age female. Electronically Signed   By: Kreg Shropshire M.D.   On: 04/16/2020 17:01   DG Chest Port 1 View  Result Date: 04/16/2020 CLINICAL DATA:  Abdominal pain. EXAM: PORTABLE CHEST 1 VIEW COMPARISON:  None. FINDINGS: The heart size and mediastinal contours are within normal limits. Both lungs are clear. The visualized skeletal structures are unremarkable. IMPRESSION: No active disease. Electronically Signed   By: Lupita Raider M.D.   On: 04/16/2020 15:40   Scheduled Meds: Continuous Infusions: . lactated ringers with kcl 100 mL/hr at 04/16/20 2003  . lactated ringers Stopped (04/16/20 1854)     LOS: 1 day   Time spent:  Azucena Fallen, DO [facility]  If 7PM-7AM, please contact night-coverage [email or web address]  04/17/2020, 8:03 AM

## 2020-04-17 NOTE — Progress Notes (Signed)
   04/16/20 1914  Assess: MEWS Score  Temp 98.7 F (37.1 C)  BP 122/66  Pulse Rate (!) 126  Resp (!) 24  SpO2 97 %  O2 Device Room Air  Assess: MEWS Score  MEWS Temp 0  MEWS Systolic 0  MEWS Pulse 2  MEWS RR 1  MEWS LOC 0  MEWS Score 3  MEWS Score Color Yellow  Assess: if the MEWS score is Yellow or Red  Were vital signs taken at a resting state? Yes  Focused Assessment No change from prior assessment  Early Detection of Sepsis Score *See Row Information* High  MEWS guidelines implemented *See Row Information* No, previously yellow, continue vital signs every 4 hours  Treat  MEWS Interventions Administered scheduled meds/treatments (fluids)  Pain Scale 0-10  Pain Score 4  Pain Location Abdomen  Pain Intervention(s) Emotional support  Take Vital Signs  Increase Vital Sign Frequency  Yellow: Q 2hr X 2 then Q 4hr X 2, if remains yellow, continue Q 4hrs  Escalate  MEWS: Escalate Yellow: discuss with charge nurse/RN and consider discussing with provider and RRT  Notify: Charge Nurse/RN  Name of Charge Nurse/RN Notified Roni RN  Date Charge Nurse/RN Notified 04/16/20  Time Charge Nurse/RN Notified 1915  Notify: Provider  Provider Name/Title [facility]  Date Provider Notified 04/16/20  Time Provider Notified 2025  Notification Type Page  Notification Reason Other (Comment) (new admit)  Provider response See new orders  Date of Provider Response 04/16/20  Time of Provider Response 2025  Notify: Rapid Response  Name of Rapid Response RN Notified Erin  Date Rapid Response Notified 04/16/20  Time Rapid Response Notified 2030  Document  Patient Outcome Stabilized after interventions  Progress note created (see row info) Yes

## 2020-04-18 DIAGNOSIS — R7989 Other specified abnormal findings of blood chemistry: Secondary | ICD-10-CM

## 2020-04-18 DIAGNOSIS — E876 Hypokalemia: Secondary | ICD-10-CM

## 2020-04-18 DIAGNOSIS — E872 Acidosis: Secondary | ICD-10-CM

## 2020-04-18 DIAGNOSIS — E669 Obesity, unspecified: Secondary | ICD-10-CM

## 2020-04-18 DIAGNOSIS — K521 Toxic gastroenteritis and colitis: Secondary | ICD-10-CM

## 2020-04-18 DIAGNOSIS — N179 Acute kidney failure, unspecified: Secondary | ICD-10-CM

## 2020-04-18 DIAGNOSIS — K529 Noninfective gastroenteritis and colitis, unspecified: Secondary | ICD-10-CM

## 2020-04-18 DIAGNOSIS — E871 Hypo-osmolality and hyponatremia: Secondary | ICD-10-CM

## 2020-04-18 LAB — COMPREHENSIVE METABOLIC PANEL
ALT: 16 U/L (ref 0–44)
AST: 20 U/L (ref 15–41)
Albumin: 2.5 g/dL — ABNORMAL LOW (ref 3.5–5.0)
Alkaline Phosphatase: 46 U/L (ref 38–126)
Anion gap: 8 (ref 5–15)
BUN: 14 mg/dL (ref 6–20)
CO2: 23 mmol/L (ref 22–32)
Calcium: 8.4 mg/dL — ABNORMAL LOW (ref 8.9–10.3)
Chloride: 107 mmol/L (ref 98–111)
Creatinine, Ser: 0.93 mg/dL (ref 0.44–1.00)
GFR, Estimated: >60 mL/min (ref >60)
Glucose, Bld: 112 mg/dL — ABNORMAL HIGH (ref 70–99)
Potassium: 3.7 mmol/L (ref 3.5–5.1)
Sodium: 138 mmol/L (ref 135–145)
Total Bilirubin: 0.6 mg/dL (ref 0.3–1.2)
Total Protein: 6.1 g/dL — ABNORMAL LOW (ref 6.5–8.1)

## 2020-04-18 LAB — CBC
HCT: 30.4 % — ABNORMAL LOW (ref 36.0–46.0)
Hemoglobin: 10.1 g/dL — ABNORMAL LOW (ref 12.0–15.0)
MCH: 24.5 pg — ABNORMAL LOW (ref 26.0–34.0)
MCHC: 33.2 g/dL (ref 30.0–36.0)
MCV: 73.6 fL — ABNORMAL LOW (ref 80.0–100.0)
Platelets: 259 10*3/uL (ref 150–400)
RBC: 4.13 MIL/uL (ref 3.87–5.11)
RDW: 14.6 % (ref 11.5–15.5)
WBC: 14.3 10*3/uL — ABNORMAL HIGH (ref 4.0–10.5)
nRBC: 0 % (ref 0.0–0.2)

## 2020-04-18 MED ORDER — ONDANSETRON HCL 4 MG PO TABS: 4.0000 mg | ORAL_TABLET | Freq: Three times a day (TID) | ORAL | 0 refills | Status: AC | PRN

## 2020-04-18 MED ORDER — KCL-LACTATED RINGERS 20 MEQ/L IV SOLN: INTRAVENOUS | Status: DC

## 2020-04-18 NOTE — Discharge Summary (Addendum)
Physician Discharge Summary  Abigail Grant IOE:[id number] DOB: 2000/08/05 DOA: 04/16/2020  PCP: Patient, No Pcp Per (Inactive)  Admit date: 04/16/2020 Discharge date: 04/18/2020  Admitted From: Home Disposition: Home  Recommendations for Outpatient Follow-up:  1. Follow ups as below. 2. Please obtain CBC/BMP/Mag at follow up 3. Please follow up on the following pending results: None  Home Health: None Equipment/Devices: None  Discharge Condition: Stable CODE STATUS: Full code    Hospital Course: 20 year old F with PMH of asthma presented to ED with intractable nausea, vomiting, diarrhea and abdominal pain following URI symptoms, and admitted for possible viral gastroenteritis, AKI and electrolyte abnormalities.  CT abdomen and pelvis concerning for enterocolitis.  Blood culture and C. difficile was negative.  GI symptoms, electrolyte abnormalities and AKI improved with IV fluid hydration and antiemetics.  She tolerated full liquid diet but did not want to try soft diet saying hospital food is "nasty".  She was encouraged to maintain good hydration and and advised to return to the hospital if symptoms worsen again.  Discharge Diagnoses:  Acute enterocolitis-likely viral given recent URI symptoms.  GI symptoms improved.  She tolerated full liquid diet but did want I try soft diet in the hospital.  Blood culture and C. difficile negative.  Sepsis ruled out.  AKI/azotemia: Resolved. Recent Labs    04/16/20 1432 04/17/20 0343 04/18/20 0351  BUN 62* 38* 14  CREATININE 4.18* 2.00* 0.93   Hyponatremia/hypokalemia/hypomagnesemia/metabolic acidosis: Due to #1.  Resolved.  Leukocytosis/bandemia: Improved. -Repeat CBC at follow-up  Class II obesity Body mass index is 36.58 kg/m.  -Encourage lifestyle change to lose weight.           Discharge Exam: Vitals:   04/18/20 0448 04/18/20 1347  BP: 119/65 (!) 146/86  Pulse: 97 (!) 105  Resp: 16 18  Temp: 98.5 F (36.9 C)  99 F (37.2 C)  SpO2: 98% 99%    GENERAL: No apparent distress.  Nontoxic. HEENT: MMM.  Vision and hearing grossly intact.  NECK: Supple.  No apparent JVD.  RESP: On RA.  No IWOB.  Fair aeration bilaterally. CVS:  RRR. Heart sounds normal.  ABD/GI/GU: Bowel sounds present. Soft. Non tender.  MSK/EXT:  Moves extremities. No apparent deformity. No edema.  SKIN: no apparent skin lesion or wound NEURO: Awake, alert and oriented appropriately.  No apparent focal neuro deficit. PSYCH: Calm. Normal affect.  Discharge Instructions  Discharge Instructions    Call MD for:  extreme fatigue   Complete by: As directed    Call MD for:  persistant nausea and vomiting   Complete by: As directed    Call MD for:  severe uncontrolled pain   Complete by: As directed    Call MD for:  temperature >100.4   Complete by: As directed    Diet general   Complete by: As directed    Soft diet for the next 2 to 3 days   Discharge instructions   Complete by: As directed    It has been a pleasure taking care of you!  You were hospitalized due to nausea, vomiting, diarrhea and abdominal pain likely due to gastroenteritis (see separate discharge instruction for more on this).  His symptoms improved with intravenous fluid and nausea medications.  Continue good hydration to keep himself hydrated. You may continue soft diet for the next 2 to 3 days.  You may use Zofran as needed for nausea/vomiting.  Follow-up with your primary care doctor in 1 to 2 weeks.   Take  care,   Increase activity slowly   Complete by: As directed      Allergies as of 04/18/2020   No Known Allergies     Medication List    TAKE these medications   MULTIPLE VITAMINS/WOMENS PO Take 1 tablet by mouth daily.   ondansetron 4 MG tablet Commonly known as: Zofran Take 1 tablet (4 mg total) by mouth every 8 (eight) hours as needed for up to 20 days for nausea or vomiting.   Super Greens Powd Take 1 Scoop by mouth daily.        Consultations:  None  Procedures/Studies:   CT Abdomen Pelvis Wo Contrast  Result Date: 04/16/2020 CLINICAL DATA:  Nausea, vomiting and diarrhea with abdominal pain for 5 days. EXAM: CT ABDOMEN AND PELVIS WITHOUT CONTRAST TECHNIQUE: Multidetector CT imaging of the abdomen and pelvis was performed following the standard protocol without IV contrast. COMPARISON:  None. FINDINGS: Lower chest: Dependent atelectatic changes. Hepatobiliary: No worrisome focal liver lesions. Smooth liver surface contour. Normal hepatic attenuation. Normal gallbladder and biliary tree. Pancreas: No pancreatic ductal dilatation or surrounding inflammatory changes. Spleen: Normal in size. No concerning splenic lesions. Adrenals/Urinary Tract: Normal adrenal glands. Kidneys symmetric in size and normally located. No visible or contour deforming renal lesions. No urolithiasis or hydronephrosis. Urinary bladder is largely decompressed at the time of exam and therefore poorly evaluated by CT imaging. Stomach/Bowel: Distal esophagus unremarkable. Stomach distended with fluid and ingested material. Duodenum with a normal sweep across the midline abdomen. Much of the small bowel and colon appears largely fluid-filled and mildly thickened albeit without frank distention or an abrupt transition point to suggest high-grade obstruction. Hazy perienteric edematous changes with reactive adenopathy throughout the mesentery. Lack of formed stool through the colon suggestive of a rapid transit state. Vascular/Lymphatic: No significant vascular findings. Extensive reactive appearing adenopathy in the mid mesentery. No pathologically enlarged nodes within limitations of this unenhanced CT. Reproductive: Uterus and bilateral adnexa are unremarkable. Other: Hazy edematous changes throughout the mesentery. Trace low-attenuation free fluid in the deep pelvis, favor reactive or physiologic. No free air. No bowel containing hernia. Metallic  ornamentation of the umbilicus. Tiny fat containing umbilical hernia. Musculoskeletal: No acute or significant osseous findings. IMPRESSION: 1. Much of the small bowel and colon appears largely fluid-filled and mildly thickened albeit without frank distention or an abrupt transition point to suggest high-grade obstruction. Lack of formed stool through the colon suggestive of a rapid transit state. Additional hazy perienteric edematous changes with reactive adenopathy throughout the mesentery. Findings are suggestive of nonspecific enterocolitis. 2. Trace low-attenuation free fluid in the deep pelvis, favor reactive or physiologic in a reproductive age female. Electronically Signed   By: Kreg ShropshirePrice  DeHay M.D.   On: 04/16/2020 17:01   DG Chest Port 1 View  Result Date: 04/16/2020 CLINICAL DATA:  Abdominal pain. EXAM: PORTABLE CHEST 1 VIEW COMPARISON:  None. FINDINGS: The heart size and mediastinal contours are within normal limits. Both lungs are clear. The visualized skeletal structures are unremarkable. IMPRESSION: No active disease. Electronically Signed   By: Lupita RaiderJames  Green Jr M.D.   On: 04/16/2020 15:40        The results of significant diagnostics from this hospitalization (including imaging, microbiology, ancillary and laboratory) are listed below for reference.     Microbiology: Recent Results (from the past 240 hour(s))  Culture, blood (Routine X 2) w Reflex to ID Panel     Status: None (Preliminary result)   Collection Time: 04/16/20  2:40 PM  Specimen: BLOOD  Result Value Ref Range Status   Specimen Description   Final    BLOOD LEFT ANTECUBITAL Performed at [facility], [address], [place], [place] [address]    Special Requests   Final    BOTTLES DRAWN AEROBIC ONLY Blood Culture adequate volume   Culture   Final    NO GROWTH 2 DAYS Performed at [facility], [address], [place], [place] [address]    Report Status PENDING  Incomplete  Resp Panel by  RT-PCR (Flu A&B, Covid) Nasopharyngeal Swab     Status: None   Collection Time: 04/16/20  2:50 PM   Specimen: Nasopharyngeal Swab; Nasopharyngeal(NP) swabs in vial transport medium  Result Value Ref Range Status   SARS Coronavirus 2 by RT PCR NEGATIVE NEGATIVE Final    Comment: (NOTE) SARS-CoV-2 target nucleic acids are NOT DETECTED.  The SARS-CoV-2 RNA is generally detectable in upper respiratory specimens during the acute phase of infection. The lowest concentration of SARS-CoV-2 viral copies this assay can detect is 138 copies/mL. A negative result does not preclude SARS-Cov-2 infection and should not be used as the sole basis for treatment or other patient management decisions. A negative result may occur with  improper specimen collection/handling, submission of specimen other than nasopharyngeal swab, presence of viral mutation(s) within the areas targeted by this assay, and inadequate number of viral copies(<138 copies/mL). A negative result must be combined with clinical observations, patient history, and epidemiological information. The expected result is Negative.  Fact Sheet for Patients:  BloggerCourse.com  Fact Sheet for Healthcare Providers:  SeriousBroker.it  This test is no t yet approved or cleared by the [place] FDA and  has been authorized for detection and/or diagnosis of SARS-CoV-2 by FDA under an Emergency Use Authorization (EUA). This EUA will remain  in effect (meaning this test can be used) for the duration of the COVID-19 declaration under Section 564(b)(1) of the Act, 21 U.S.C.section 360bbb-3(b)(1), unless the authorization is terminated  or revoked sooner.       Influenza A by PCR NEGATIVE NEGATIVE Final   Influenza B by PCR NEGATIVE NEGATIVE Final    Comment: (NOTE) The Xpert Xpress SARS-CoV-2/FLU/RSV plus assay is intended as an aid in the diagnosis of influenza from Nasopharyngeal swab  specimens and should not be used as a sole basis for treatment. Nasal washings and aspirates are unacceptable for Xpert Xpress SARS-CoV-2/FLU/RSV testing.  Fact Sheet for Patients: BloggerCourse.com  Fact Sheet for Healthcare Providers: SeriousBroker.it  This test is not yet approved or cleared by the [place] FDA and has been authorized for detection and/or diagnosis of SARS-CoV-2 by FDA under an Emergency Use Authorization (EUA). This EUA will remain in effect (meaning this test can be used) for the duration of the COVID-19 declaration under Section 564(b)(1) of the Act, 21 U.S.C. section 360bbb-3(b)(1), unless the authorization is terminated or revoked.  Performed at [facility], [address], [place], [place] [address]   Culture, blood (Routine X 2) w Reflex to ID Panel     Status: None (Preliminary result)   Collection Time: 04/16/20  2:50 PM   Specimen: BLOOD LEFT FOREARM  Result Value Ref Range Status   Specimen Description   Final    BLOOD LEFT FOREARM Performed at [facility], [address] [place], [place] [address]    Special Requests   Final    BOTTLES DRAWN AEROBIC AND ANAEROBIC Blood Culture adequate volume Performed  at [facility], [address] [place], [place] [address]    Culture   Final    NO GROWTH 2 DAYS Performed at [facility], [address] [place], [place] [address]    Report Status PENDING  Incomplete  C Difficile Quick Screen w PCR reflex     Status: None   Collection Time: 04/17/20  3:00 AM   Specimen: STOOL  Result Value Ref Range Status   C Diff antigen NEGATIVE NEGATIVE Final   C Diff toxin NEGATIVE NEGATIVE Final   C Diff interpretation No C. difficile detected.  Final    Comment: Performed at [facility], [address], [place], [place] [address]     Labs:  CBC: Recent Labs  Lab  04/16/20 1432 04/17/20 0343 04/18/20 0351  WBC 23.5* 12.4* 14.3*  NEUTROABS 21.9*  --   --   HGB 13.4 10.7* 10.1*  HCT 40.6 31.8* 30.4*  MCV 73.2* 71.9* 73.6*  PLT 316 244 259   BMP &GFR Recent Labs  Lab 04/16/20 1432 04/16/20 2123 04/17/20 0343 04/18/20 0351  NA 132*  --  134* 138  K 3.0*  --  3.0* 3.7  CL 98  --  104 107  CO2 17*  --  20* 23  GLUCOSE 127*  --  116* 112*  BUN 62*  --  38* 14  CREATININE 4.18*  --  2.00* 0.93  CALCIUM 8.6*  --  8.0* 8.4*  MG  --  1.9  --   --    Estimated Creatinine Clearance: 100.2 mL/min (by C-G formula based on SCr of 0.93 mg/dL). Liver & Pancreas: Recent Labs  Lab 04/16/20 1432 04/17/20 0343 04/18/20 0351  AST 25 20 20   ALT 21 18 16   ALKPHOS 87 57 46  BILITOT 0.9 0.7 0.6  PROT 8.6* 6.8 6.1*  ALBUMIN 3.4* 2.6* 2.5*   Recent Labs  Lab 04/16/20 1432  LIPASE 34   No results for input(s): AMMONIA in the last 168 hours. Diabetic: No results for input(s): HGBA1C in the last 72 hours. Recent Labs  Lab 04/16/20 1400  GLUCAP 127*   Cardiac Enzymes: No results for input(s): CKTOTAL, CKMB, CKMBINDEX, TROPONINI in the last 168 hours. No results for input(s): PROBNP in the last 8760 hours. Coagulation Profile: No results for input(s): INR, PROTIME in the last 168 hours. Thyroid Function Tests: No results for input(s): TSH, T4TOTAL, FREET4, T3FREE, THYROIDAB in the last 72 hours. Lipid Profile: No results for input(s): CHOL, HDL, LDLCALC, TRIG, CHOLHDL, LDLDIRECT in the last 72 hours. Anemia Panel: No results for input(s): VITAMINB12, FOLATE, FERRITIN, TIBC, IRON, RETICCTPCT in the last 72 hours. Urine analysis:    Component Value Date/Time   BILIRUBINUR neg 05/04/2013 1852   PROTEINUR 100 05/04/2013 1852   UROBILINOGEN 0.2 05/04/2013 1852   NITRITE neg 05/04/2013 1852   LEUKOCYTESUR Trace 05/04/2013 1852   Sepsis Labs: Invalid input(s): PROCALCITONIN, LACTICIDVEN   Time coordinating discharge: 40  minutes  SIGNED:  05/06/2013, MD  [facility] 04/18/2020, 7:29 PM  If 7PM-7AM, please contact night-coverage [email or web address]

## 2020-04-18 NOTE — Discharge Instructions (Signed)

## 2020-04-18 NOTE — Progress Notes (Signed)
MD order to discharge.  Discharge instructions and work/school excuse note given to pt and pt's mother. Aware to pick up medication ordered from pharmacy listed on discharge instructions. Both verbalize understanding of discharge instructions.   Discharge via wheelchair accompanied by family and staff.

## 2020-04-21 LAB — CULTURE, BLOOD (ROUTINE X 2)
Culture: NO GROWTH
Culture: NO GROWTH
Special Requests: ADEQUATE
Special Requests: ADEQUATE

## 2022-02-25 IMAGING — CT CT ABD-PELV W/O CM
2 of 4 series · 15 of 46 positions shown, 17 images · non-contrast
Comparison: None.

CLINICAL DATA: Nausea, vomiting and diarrhea with abdominal pain
for 5 days.

EXAM:
CT ABDOMEN AND PELVIS WITHOUT CONTRAST
TECHNIQUE: Multidetector CT imaging of the abdomen and pelvis was performed
following the standard protocol without IV contrast.

[Series 2: axial st · axial · 0.88mm/px · z∈[-474,-38]mm · 12 of 99 slices shown, 14 images]
[im 6/99  soft-tissue]
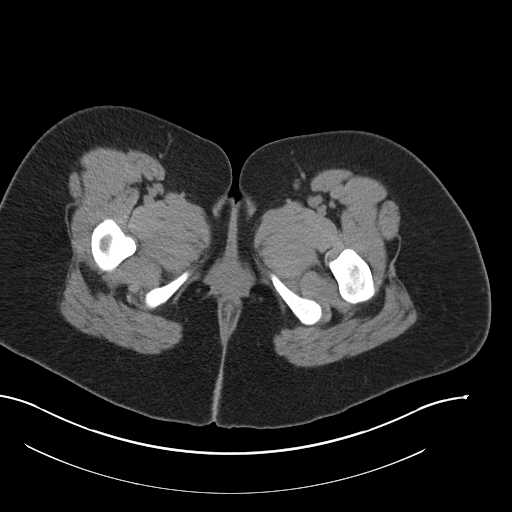
[im 6/99  bone]
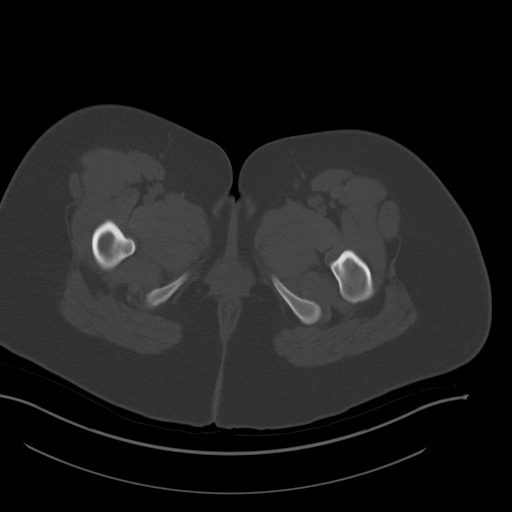
[im 17/99  soft-tissue]
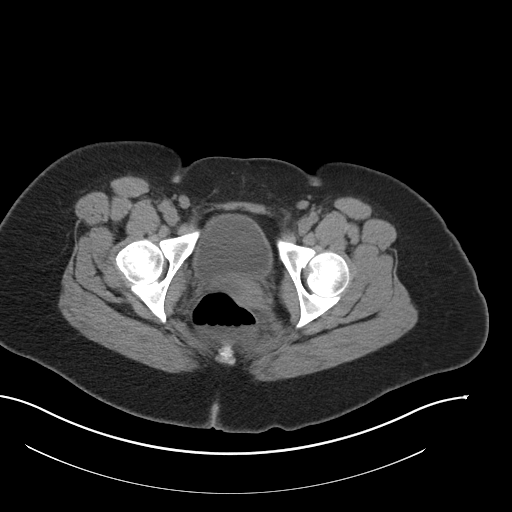
[im 22/99  soft-tissue]
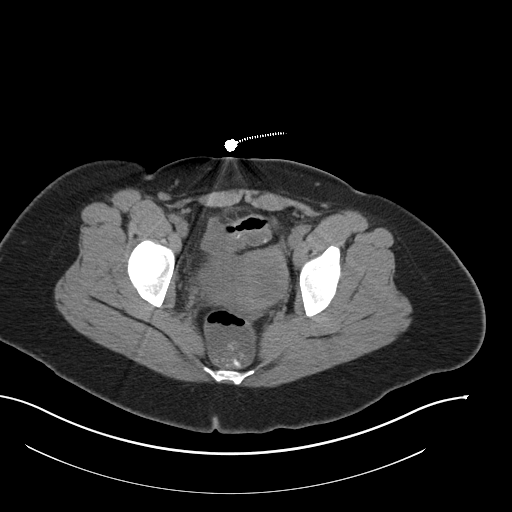
[im 28/99  soft-tissue]
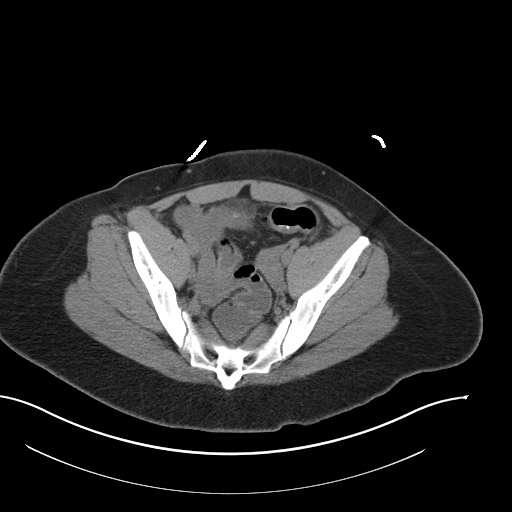
[im 39/99  soft-tissue]
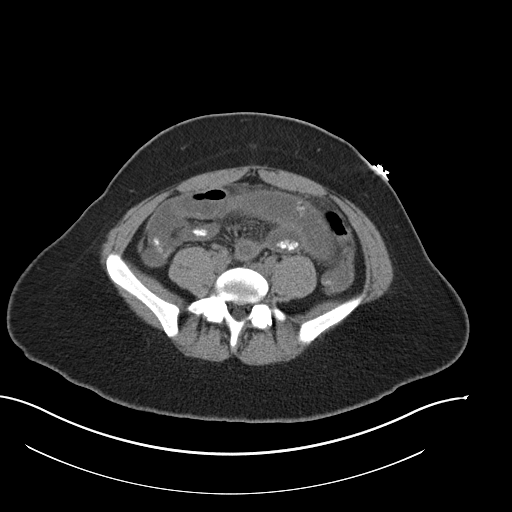
[im 44/99  soft-tissue]
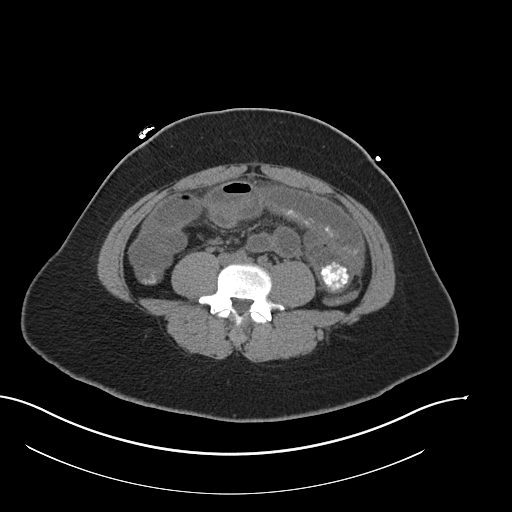
[im 55/99  soft-tissue]
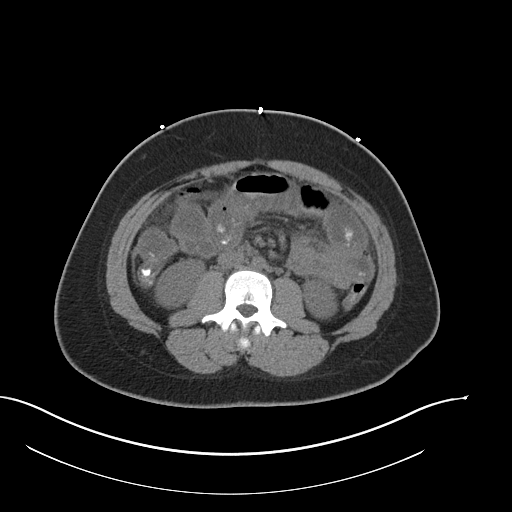
[im 60/99  soft-tissue]
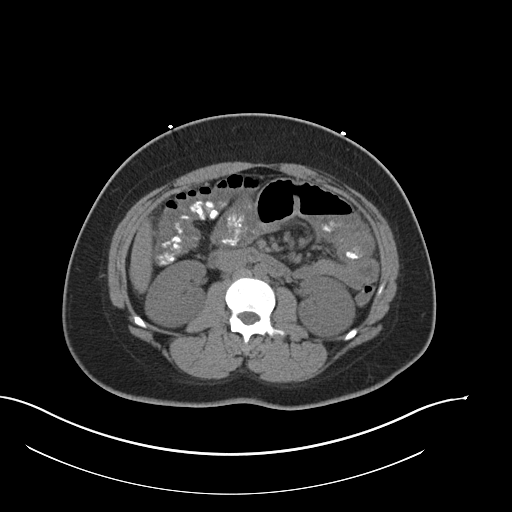
[im 71/99  soft-tissue]
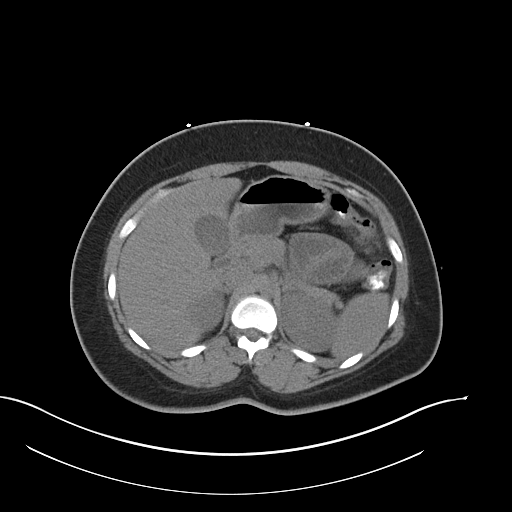
[im 71/99  bone]
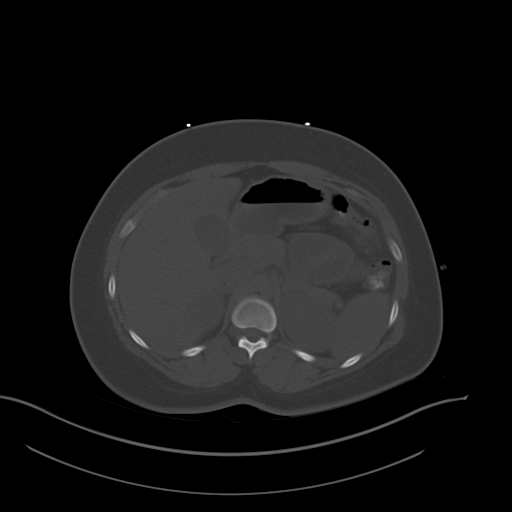
[im 77/99  soft-tissue]
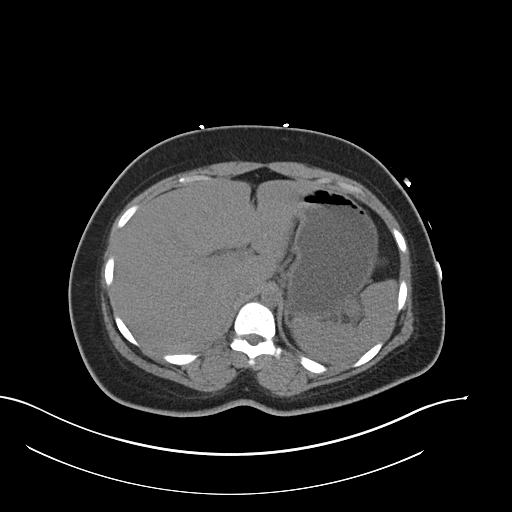
[im 82/99  soft-tissue]
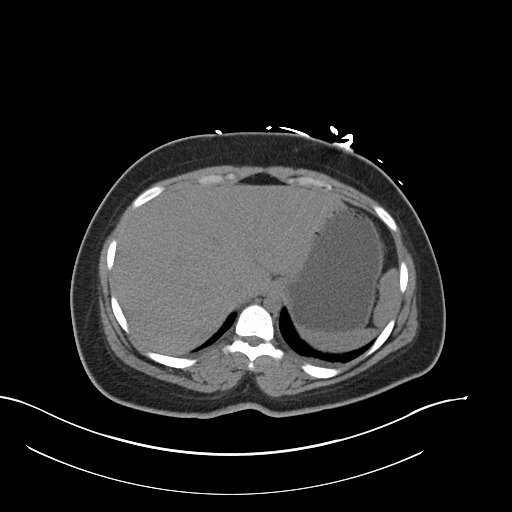
[im 93/99  soft-tissue]
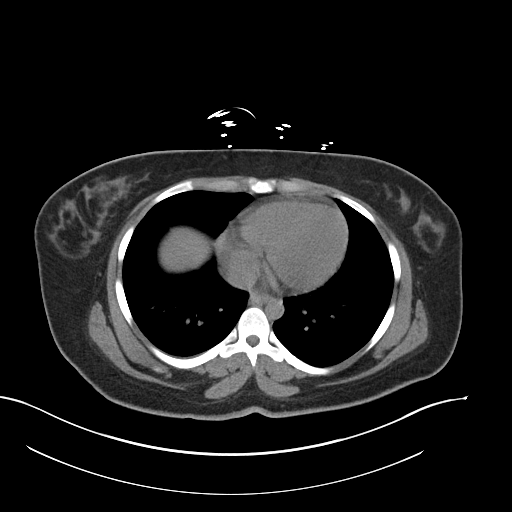

[Series 5: coronal st · coronal · 0.78mm/px · 3 of 150 slices shown]
[im 50/150  soft-tissue]
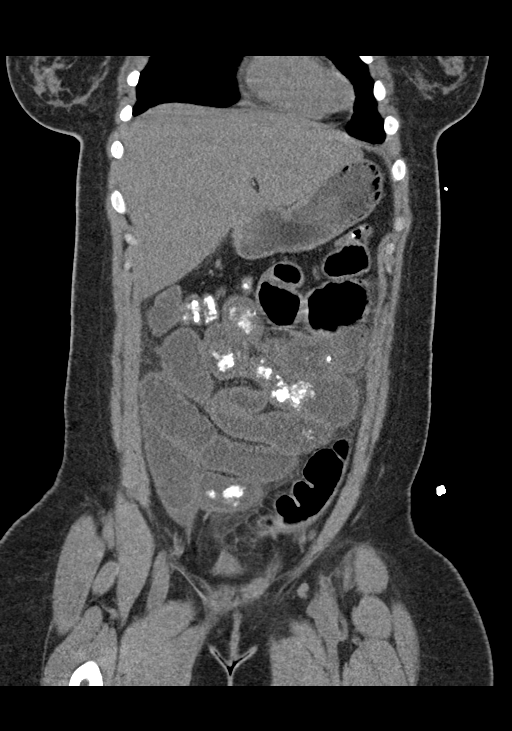
[im 67/150  soft-tissue]
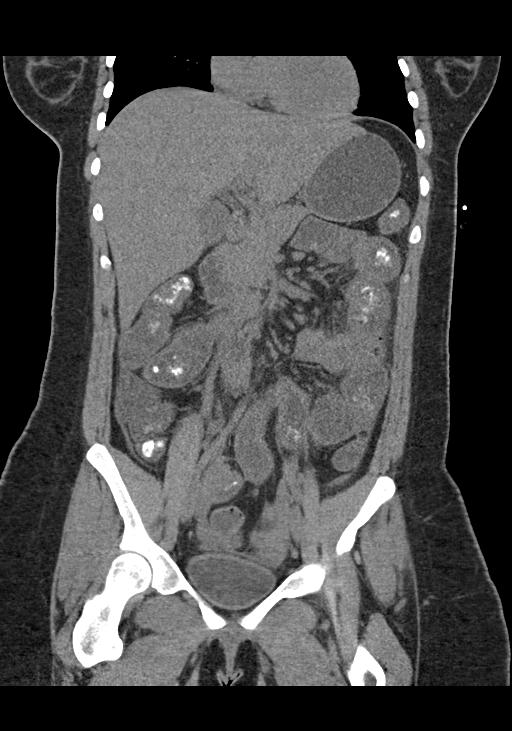
[im 83/150  soft-tissue]
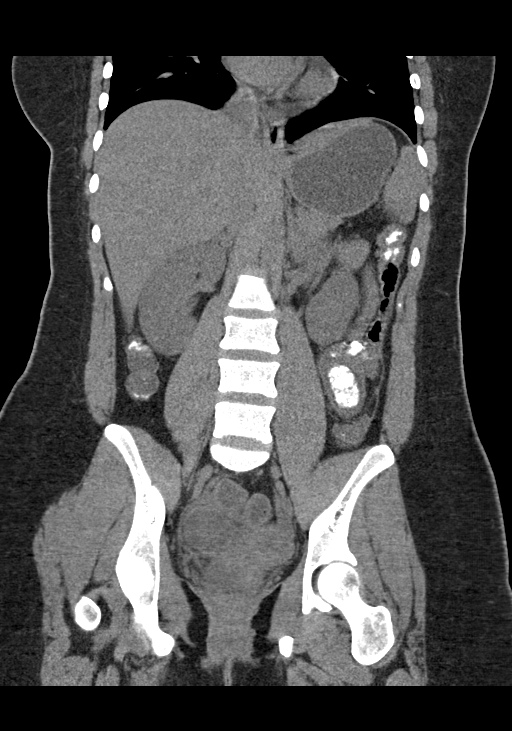

[15 of 46 positions shown; findings below may reference images not displayed]

FINDINGS: Lower chest: Dependent atelectatic changes.

Hepatobiliary: No worrisome focal liver lesions. Smooth liver
surface contour. Normal hepatic attenuation. Normal gallbladder and
biliary tree.

Pancreas: No pancreatic ductal dilatation or surrounding
inflammatory changes.

Spleen: Normal in size. No concerning splenic lesions.

Adrenals/Urinary Tract: Normal adrenal glands. Kidneys symmetric in
size and normally located. No visible or contour deforming renal
lesions. No urolithiasis or hydronephrosis. Urinary bladder is
largely decompressed at the time of exam and therefore poorly
evaluated by CT imaging.

Stomach/Bowel: Distal esophagus unremarkable. Stomach distended with
fluid and ingested material. Duodenum with a normal sweep across the
midline abdomen. Much of the small bowel and colon appears largely
fluid-filled and mildly thickened albeit without frank distention or
an abrupt transition point to suggest high-grade obstruction. Hazy
perienteric edematous changes with reactive adenopathy throughout
the mesentery. Lack of formed stool through the colon suggestive of
a rapid transit state.

Vascular/Lymphatic: No significant vascular findings. Extensive
reactive appearing adenopathy in the mid mesentery. No
pathologically enlarged nodes within limitations of this unenhanced
CT.

Reproductive: Uterus and bilateral adnexa are unremarkable.

Other: Hazy edematous changes throughout the mesentery. Trace
low-attenuation free fluid in the deep pelvis, favor reactive or
physiologic. No free air. No bowel containing hernia. Metallic
ornamentation of the umbilicus. Tiny fat containing umbilical
hernia.

Musculoskeletal: No acute or significant osseous findings.
IMPRESSION: 1. Much of the small bowel and colon appears largely fluid-filled
and mildly thickened albeit without frank distention or an abrupt
transition point to suggest high-grade obstruction. Lack of formed
stool through the colon suggestive of a rapid transit state.
Additional hazy perienteric edematous changes with reactive
adenopathy throughout the mesentery. Findings are suggestive of
nonspecific enterocolitis.
2. Trace low-attenuation free fluid in the deep pelvis, favor
reactive or physiologic in a reproductive age female.

## 2022-11-10 ENCOUNTER — Other Ambulatory Visit (HOSPITAL_COMMUNITY): Payer: Self-pay

## 2022-11-10 MED ORDER — LISDEXAMFETAMINE DIMESYLATE 20 MG PO CAPS
20.0000 mg | ORAL_CAPSULE | Freq: Every morning | ORAL | 0 refills | Status: AC
  Filled 2022-11-10 – 2022-11-17 (×4): qty 30, 30d supply, fill #0

## 2022-11-10 MED ORDER — HYDROXYZINE PAMOATE 25 MG PO CAPS
25.0000 mg | ORAL_CAPSULE | Freq: Every evening | ORAL | 0 refills | Status: AC
  Filled 2022-11-10: qty 60, 30d supply, fill #0

## 2022-11-14 ENCOUNTER — Other Ambulatory Visit (HOSPITAL_COMMUNITY): Payer: Self-pay

## 2022-11-17 ENCOUNTER — Other Ambulatory Visit (HOSPITAL_COMMUNITY): Payer: Self-pay

## 2022-11-17 MED ORDER — LISDEXAMFETAMINE DIMESYLATE 20 MG PO CAPS
20.0000 mg | ORAL_CAPSULE | Freq: Every morning | ORAL | 0 refills | Status: AC
  Filled 2022-11-17: qty 30, 30d supply, fill #0

## 2022-11-19 ENCOUNTER — Other Ambulatory Visit (HOSPITAL_COMMUNITY): Payer: Self-pay

## 2022-11-19 MED ORDER — AMPHETAMINE-DEXTROAMPHET ER 10 MG PO CP24
10.0000 mg | ORAL_CAPSULE | Freq: Every morning | ORAL | 0 refills | Status: AC
  Filled 2022-11-19 (×2): qty 15, 15d supply, fill #0

## 2023-05-28 ENCOUNTER — Other Ambulatory Visit (HOSPITAL_BASED_OUTPATIENT_CLINIC_OR_DEPARTMENT_OTHER): Payer: Self-pay

## 2023-05-28 MED ORDER — ADDERALL XR 25 MG PO CP24
25.0000 mg | ORAL_CAPSULE | Freq: Every morning | ORAL | 0 refills | Status: AC
Start: 2023-05-28 — End: ?
  Filled 2023-05-28: qty 30, 30d supply, fill #0

## 2023-05-30 ENCOUNTER — Other Ambulatory Visit (HOSPITAL_BASED_OUTPATIENT_CLINIC_OR_DEPARTMENT_OTHER): Payer: Self-pay

## 2023-06-01 ENCOUNTER — Other Ambulatory Visit (HOSPITAL_BASED_OUTPATIENT_CLINIC_OR_DEPARTMENT_OTHER): Payer: Self-pay

## 2023-06-30 ENCOUNTER — Other Ambulatory Visit: Payer: Self-pay

## 2023-06-30 ENCOUNTER — Other Ambulatory Visit (HOSPITAL_BASED_OUTPATIENT_CLINIC_OR_DEPARTMENT_OTHER): Payer: Self-pay

## 2023-06-30 MED ORDER — ESCITALOPRAM OXALATE 5 MG PO TABS
5.0000 mg | ORAL_TABLET | Freq: Every day | ORAL | 1 refills | Status: AC
  Filled 2023-06-30 (×2): qty 30, 30d supply, fill #0

## 2023-06-30 MED ORDER — AMPHETAMINE-DEXTROAMPHET ER 20 MG PO CP24
20.0000 mg | ORAL_CAPSULE | ORAL | 0 refills | Status: DC
  Filled 2023-06-30 (×2): qty 30, 30d supply, fill #0

## 2023-07-01 ENCOUNTER — Other Ambulatory Visit: Payer: Self-pay

## 2023-07-29 ENCOUNTER — Other Ambulatory Visit (HOSPITAL_BASED_OUTPATIENT_CLINIC_OR_DEPARTMENT_OTHER): Payer: Self-pay

## 2023-07-29 MED ORDER — ESCITALOPRAM OXALATE 10 MG PO TABS
10.0000 mg | ORAL_TABLET | Freq: Every day | ORAL | 1 refills | Status: DC
  Filled 2023-07-29: qty 30, 30d supply, fill #0
  Filled 2023-08-28: qty 30, 30d supply, fill #1

## 2023-07-29 MED ORDER — AMPHETAMINE-DEXTROAMPHET ER 20 MG PO CP24
20.0000 mg | ORAL_CAPSULE | Freq: Every morning | ORAL | 0 refills | Status: DC
  Filled 2023-07-29: qty 30, 30d supply, fill #0

## 2023-07-29 MED ORDER — NORGESTIMATE-ETH ESTRADIOL 0.25-35 MG-MCG PO TABS
1.0000 | ORAL_TABLET | Freq: Every day | ORAL | 3 refills | Status: AC
  Filled 2023-07-29 – 2023-08-03 (×2): qty 84, 84d supply, fill #0
  Filled 2023-08-11: qty 28, 28d supply, fill #1
  Filled 2023-08-11: qty 28, 28d supply, fill #0
  Filled 2023-08-28: qty 28, 28d supply, fill #1
  Filled 2023-11-11: qty 84, 84d supply, fill #2
  Filled 2023-11-14 – 2023-11-23 (×2): qty 28, 28d supply, fill #3
  Filled 2024-02-02: qty 84, 84d supply, fill #3

## 2023-08-03 ENCOUNTER — Other Ambulatory Visit (HOSPITAL_BASED_OUTPATIENT_CLINIC_OR_DEPARTMENT_OTHER): Payer: Self-pay

## 2023-08-03 ENCOUNTER — Other Ambulatory Visit: Payer: Self-pay

## 2023-08-11 ENCOUNTER — Other Ambulatory Visit (HOSPITAL_BASED_OUTPATIENT_CLINIC_OR_DEPARTMENT_OTHER): Payer: Self-pay

## 2023-08-28 ENCOUNTER — Other Ambulatory Visit (HOSPITAL_BASED_OUTPATIENT_CLINIC_OR_DEPARTMENT_OTHER): Payer: Self-pay

## 2023-08-31 ENCOUNTER — Other Ambulatory Visit (HOSPITAL_BASED_OUTPATIENT_CLINIC_OR_DEPARTMENT_OTHER): Payer: Self-pay

## 2023-09-15 ENCOUNTER — Other Ambulatory Visit (HOSPITAL_BASED_OUTPATIENT_CLINIC_OR_DEPARTMENT_OTHER): Payer: Self-pay

## 2023-09-15 MED ORDER — AMPHETAMINE-DEXTROAMPHET ER 20 MG PO CP24
20.0000 mg | ORAL_CAPSULE | Freq: Every day | ORAL | 0 refills | Status: AC
  Filled 2023-09-15: qty 30, 30d supply, fill #0

## 2023-09-15 MED ORDER — AMPHETAMINE-DEXTROAMPHET ER 20 MG PO CP24: 20.0000 mg | ORAL_CAPSULE | Freq: Every day | ORAL | 0 refills | Status: AC

## 2023-09-15 MED ORDER — ESCITALOPRAM OXALATE 10 MG PO TABS
10.0000 mg | ORAL_TABLET | Freq: Every day | ORAL | 2 refills | Status: AC
  Filled 2023-09-15: qty 90, 90d supply, fill #0

## 2023-10-19 ENCOUNTER — Other Ambulatory Visit (HOSPITAL_BASED_OUTPATIENT_CLINIC_OR_DEPARTMENT_OTHER): Payer: Self-pay

## 2023-10-19 MED ORDER — AMPHETAMINE-DEXTROAMPHET ER 20 MG PO CP24
20.0000 mg | ORAL_CAPSULE | Freq: Every day | ORAL | 0 refills | Status: AC
  Filled 2023-10-19: qty 22, 22d supply, fill #0

## 2023-10-21 ENCOUNTER — Other Ambulatory Visit (HOSPITAL_BASED_OUTPATIENT_CLINIC_OR_DEPARTMENT_OTHER): Payer: Self-pay

## 2023-10-27 ENCOUNTER — Other Ambulatory Visit (HOSPITAL_BASED_OUTPATIENT_CLINIC_OR_DEPARTMENT_OTHER): Payer: Self-pay

## 2023-10-27 MED ORDER — SERTRALINE HCL 25 MG PO TABS
50.0000 mg | ORAL_TABLET | Freq: Every day | ORAL | 1 refills | Status: AC
  Filled 2023-10-27: qty 60, 30d supply, fill #0

## 2023-10-27 MED ORDER — LISDEXAMFETAMINE DIMESYLATE 30 MG PO CAPS
30.0000 mg | ORAL_CAPSULE | Freq: Every morning | ORAL | 0 refills | Status: AC
  Filled 2023-10-27 – 2023-10-28 (×2): qty 30, 30d supply, fill #0

## 2023-10-28 ENCOUNTER — Other Ambulatory Visit (HOSPITAL_BASED_OUTPATIENT_CLINIC_OR_DEPARTMENT_OTHER): Payer: Self-pay

## 2023-11-11 ENCOUNTER — Other Ambulatory Visit (HOSPITAL_BASED_OUTPATIENT_CLINIC_OR_DEPARTMENT_OTHER): Payer: Self-pay

## 2023-11-12 ENCOUNTER — Other Ambulatory Visit (HOSPITAL_BASED_OUTPATIENT_CLINIC_OR_DEPARTMENT_OTHER): Payer: Self-pay

## 2023-11-13 ENCOUNTER — Other Ambulatory Visit (HOSPITAL_BASED_OUTPATIENT_CLINIC_OR_DEPARTMENT_OTHER): Payer: Self-pay

## 2023-11-14 ENCOUNTER — Other Ambulatory Visit (HOSPITAL_COMMUNITY): Payer: Self-pay

## 2023-11-15 ENCOUNTER — Other Ambulatory Visit (HOSPITAL_COMMUNITY): Payer: Self-pay

## 2023-11-17 ENCOUNTER — Other Ambulatory Visit: Payer: Self-pay

## 2023-11-23 ENCOUNTER — Other Ambulatory Visit (HOSPITAL_BASED_OUTPATIENT_CLINIC_OR_DEPARTMENT_OTHER): Payer: Self-pay

## 2023-11-23 MED ORDER — LISDEXAMFETAMINE DIMESYLATE 40 MG PO CAPS
40.0000 mg | ORAL_CAPSULE | Freq: Every morning | ORAL | 0 refills | Status: DC
  Filled 2023-11-28: qty 30, 30d supply, fill #0

## 2023-11-23 MED ORDER — SERTRALINE HCL 100 MG PO TABS
100.0000 mg | ORAL_TABLET | Freq: Every day | ORAL | 3 refills | Status: AC
  Filled 2023-11-23: qty 30, 30d supply, fill #0
  Filled 2023-12-21: qty 30, 30d supply, fill #1
  Filled 2024-01-20: qty 30, 30d supply, fill #2
  Filled 2024-02-18: qty 30, 30d supply, fill #3

## 2023-11-28 ENCOUNTER — Other Ambulatory Visit (HOSPITAL_BASED_OUTPATIENT_CLINIC_OR_DEPARTMENT_OTHER): Payer: Self-pay

## 2023-12-01 ENCOUNTER — Other Ambulatory Visit (HOSPITAL_BASED_OUTPATIENT_CLINIC_OR_DEPARTMENT_OTHER): Payer: Self-pay

## 2023-12-15 ENCOUNTER — Other Ambulatory Visit (HOSPITAL_BASED_OUTPATIENT_CLINIC_OR_DEPARTMENT_OTHER): Payer: Self-pay

## 2023-12-15 ENCOUNTER — Other Ambulatory Visit: Payer: Self-pay

## 2023-12-15 MED ORDER — FLUTICASONE PROPIONATE 50 MCG/ACT NA SUSP
1.0000 | Freq: Every day | NASAL | 0 refills | Status: AC
  Filled 2023-12-15: qty 16, 60d supply, fill #0

## 2023-12-15 MED ORDER — PROMETHAZINE-DM 6.25-15 MG/5ML PO SYRP
5.0000 mL | ORAL_SOLUTION | Freq: Four times a day (QID) | ORAL | 0 refills | Status: AC | PRN
  Filled 2023-12-15: qty 180, 7d supply, fill #0

## 2023-12-15 MED ORDER — NOREL AD 4-10-325 MG PO TABS
1.0000 | ORAL_TABLET | Freq: Four times a day (QID) | ORAL | 0 refills | Status: AC
  Filled 2023-12-15: qty 20, 5d supply, fill #0

## 2023-12-19 ENCOUNTER — Emergency Department (HOSPITAL_BASED_OUTPATIENT_CLINIC_OR_DEPARTMENT_OTHER): Admitting: Radiology

## 2023-12-19 ENCOUNTER — Encounter (HOSPITAL_BASED_OUTPATIENT_CLINIC_OR_DEPARTMENT_OTHER): Payer: Self-pay

## 2023-12-19 ENCOUNTER — Emergency Department (HOSPITAL_BASED_OUTPATIENT_CLINIC_OR_DEPARTMENT_OTHER)
Admission: EM | Admit: 2023-12-19 | Discharge: 2023-12-19 | Disposition: A | Discharge status: Home / Self Care | Attending: Emergency Medicine | Admitting: Emergency Medicine

## 2023-12-19 ENCOUNTER — Other Ambulatory Visit: Payer: Self-pay

## 2023-12-19 DIAGNOSIS — J45909 Unspecified asthma, uncomplicated: Secondary | ICD-10-CM | POA: Diagnosis not present

## 2023-12-19 DIAGNOSIS — Z7951 Long term (current) use of inhaled steroids: Secondary | ICD-10-CM | POA: Diagnosis not present

## 2023-12-19 DIAGNOSIS — R000 Tachycardia, unspecified: Secondary | ICD-10-CM | POA: Diagnosis not present

## 2023-12-19 DIAGNOSIS — R0789 Other chest pain: Secondary | ICD-10-CM | POA: Diagnosis present

## 2023-12-19 DIAGNOSIS — J069 Acute upper respiratory infection, unspecified: Secondary | ICD-10-CM | POA: Diagnosis not present

## 2023-12-19 LAB — CBC WITH DIFFERENTIAL/PLATELET
Abs Immature Granulocytes: 0.01 K/uL (ref 0.00–0.07)
Basophils Absolute: 0.1 K/uL (ref 0.0–0.1)
Basophils Relative: 1 %
Eosinophils Absolute: 0.3 K/uL (ref 0.0–0.5)
Eosinophils Relative: 4 %
HCT: 35.9 % — ABNORMAL LOW (ref 36.0–46.0)
Hemoglobin: 11.6 g/dL — ABNORMAL LOW (ref 12.0–15.0)
Immature Granulocytes: 0 %
Lymphocytes Relative: 39 %
Lymphs Abs: 2.9 K/uL (ref 0.7–4.0)
MCH: 24.3 pg — ABNORMAL LOW (ref 26.0–34.0)
MCHC: 32.3 g/dL (ref 30.0–36.0)
MCV: 75.1 fL — ABNORMAL LOW (ref 80.0–100.0)
Monocytes Absolute: 0.6 K/uL (ref 0.1–1.0)
Monocytes Relative: 8 %
Neutro Abs: 3.4 K/uL (ref 1.7–7.7)
Neutrophils Relative %: 48 %
Platelets: 369 K/uL (ref 150–400)
RBC: 4.78 MIL/uL (ref 3.87–5.11)
RDW: 13.9 % (ref 11.5–15.5)
WBC: 7.3 K/uL (ref 4.0–10.5)
nRBC: 0 % (ref 0.0–0.2)

## 2023-12-19 LAB — LIPASE, BLOOD: Lipase: 19 U/L (ref 11–51)

## 2023-12-19 LAB — RESP PANEL BY RT-PCR (RSV, FLU A&B, COVID)  RVPGX2
Influenza A by PCR: NEGATIVE
Influenza B by PCR: NEGATIVE
Resp Syncytial Virus by PCR: NEGATIVE
SARS Coronavirus 2 by RT PCR: NEGATIVE

## 2023-12-19 LAB — HCG, SERUM, QUALITATIVE: Preg, Serum: NEGATIVE

## 2023-12-19 LAB — GROUP A STREP BY PCR: Group A Strep by PCR: NOT DETECTED

## 2023-12-19 LAB — TROPONIN T, HIGH SENSITIVITY
Troponin T High Sensitivity: <15 ng/L (ref 0–19)
Troponin T High Sensitivity: <15 ng/L (ref 0–19)

## 2023-12-19 LAB — BASIC METABOLIC PANEL WITH GFR
Anion gap: 12 (ref 5–15)
BUN: 9 mg/dL (ref 6–20)
CO2: 24 mmol/L (ref 22–32)
Calcium: 9.3 mg/dL (ref 8.9–10.3)
Chloride: 102 mmol/L (ref 98–111)
Creatinine, Ser: 0.8 mg/dL (ref 0.44–1.00)
GFR, Estimated: >60 mL/min (ref >60)
Glucose, Bld: 122 mg/dL — ABNORMAL HIGH (ref 70–99)
Potassium: 4.1 mmol/L (ref 3.5–5.1)
Sodium: 138 mmol/L (ref 135–145)

## 2023-12-19 LAB — D-DIMER, QUANTITATIVE: D-Dimer, Quant: <0.27 ug{FEU}/mL (ref 0.00–0.50)

## 2023-12-19 MED ORDER — ALBUTEROL SULFATE (2.5 MG/3ML) 0.083% IN NEBU
INHALATION_SOLUTION | RESPIRATORY_TRACT | Status: AC
  Filled 2023-12-19: qty 3

## 2023-12-19 MED ORDER — SODIUM CHLORIDE 0.9 % IV BOLUS
500.0000 mL | Freq: Once | INTRAVENOUS | Status: AC
  Administered 2023-12-19: 500 mL via INTRAVENOUS

## 2023-12-19 MED ORDER — ALBUTEROL SULFATE (2.5 MG/3ML) 0.083% IN NEBU
2.5000 mg | INHALATION_SOLUTION | Freq: Once | RESPIRATORY_TRACT | Status: AC
  Administered 2023-12-19: 2.5 mg via RESPIRATORY_TRACT

## 2023-12-19 MED ORDER — PANTOPRAZOLE SODIUM 40 MG IV SOLR
40.0000 mg | Freq: Once | INTRAVENOUS | Status: AC
  Administered 2023-12-19: 40 mg via INTRAVENOUS
  Filled 2023-12-19: qty 10

## 2023-12-19 NOTE — ED Provider Notes (Signed)
 [place] EMERGENCY DEPARTMENT AT [facility] Provider Note   CSN: [id number] Arrival date & time: 12/19/23  9143     Patient presents with: Chest Pain and Shortness of Breath   Abigail Grant is a 23 y.o. female with history of asthma, presents with concern for chest pain and tightness that she noticed this morning.  She reports that the pain is in the central area of her chest and does radiate up into the throat.  It is fairly constant.  She denies any pain with exertion.  No radiation of pain to the back.  She reports a productive cough for the past week.  She is coughing up green sputum.  Reports a mild sore throat but no difficulties with swallowing.  Denies any abdominal pain, nausea, vomiting, diarrhea.  Denies any fever or chills.  Denies any recent long plane or car rides, recent hospitalizations or surgeries, or any history of blood clot.  She is on OCPs.   HPI     Prior to Admission medications   Medication Sig Start Date End Date Taking? Authorizing Provider  ADDERALL  XR 25 MG 24 hr capsule Take 1 capsule by mouth every morning. 05/28/23     amphetamine -dextroamphetamine  (ADDERALL  XR) 10 MG 24 hr capsule Take 1 capsule (10 mg total) by mouth once daily  in the morning. 11/19/22     amphetamine -dextroamphetamine  (ADDERALL  XR) 20 MG 24 hr capsule Take 1 capsule (20 mg total) by mouth daily. 09/15/23     amphetamine -dextroamphetamine  (ADDERALL  XR) 20 MG 24 hr capsule take one capsule by mouth once daily 10/16/23     amphetamine -dextroamphetamine  (ADDERALL  XR) 20 MG 24 hr capsule Take 1 capsule (20 mg total) by mouth daily. 10/19/23     Chlorphen-PE-Acetaminophen  (NOREL AD) 4-10-325 MG TABS Take 1 tablet by mouth 4 (four) times daily. 12/15/23     escitalopram  (LEXAPRO ) 10 MG tablet Take 1 tablet (10 mg total) by mouth daily. 09/15/23     escitalopram  (LEXAPRO ) 5 MG tablet Take 1 tablet (5 mg total) by mouth daily. 06/30/23     fluticasone  (FLONASE ) 50 MCG/ACT nasal spray  Place 1 spray into both nostrils daily. 12/15/23     hydrOXYzine  (VISTARIL ) 25 MG capsule Take 1-2 capsules (25-50 mg total) by mouth every evening as needed for sleep/anxiety. 11/10/22     lisdexamfetamine (VYVANSE ) 20 MG capsule Take 1 capsule (20 mg total) by mouth every morning. 11/10/22     lisdexamfetamine (VYVANSE ) 20 MG capsule Take 1 capsule (20 mg total) by mouth in the morning. 11/11/22     lisdexamfetamine (VYVANSE ) 30 MG capsule Take 1 capsule (30 mg total) by mouth once daily in the morning. 10/27/23     lisdexamfetamine (VYVANSE ) 40 MG capsule Take 1 capsule (40 mg total) by mouth in the morning. 11/27/23     Misc Natural Products (SUPER GREENS) POWD Take 1 Scoop by mouth daily.    [provider]  Multiple Vitamins-Minerals (MULTIPLE VITAMINS/WOMENS PO) Take 1 tablet by mouth daily.    [provider]  norgestimate -ethinyl estradiol  (ESTARYLLA ) 0.25-35 MG-MCG tablet Take 1 tablet by mouth daily. 05/26/23     promethazine -dextromethorphan  (PROMETHAZINE -DM) 6.25-15 MG/5ML syrup Take 5-10 mLs by mouth every 6 (six) hours as needed for cough mainly at bedtime.  Do not take and drive. 12/15/23     sertraline  (ZOLOFT ) 100 MG tablet Take 1 tablet (100 mg total) by mouth daily. 11/23/23     sertraline  (ZOLOFT ) 25 MG tablet take 2 tablets by mouth  once daily 10/27/23       Allergies: Patient has no known allergies.    Review of Systems  Constitutional:  Negative for fever.  Respiratory:  Positive for cough.   Cardiovascular:  Positive for chest pain.    Updated Vital Signs BP 105/74   Pulse 95   Temp 98.8 F (37.1 C) (Oral)   Resp 17   Ht 5' 2 (1.575 m)   Wt 105.7 kg   LMP 12/09/2023   SpO2 99%   BMI 42.62 kg/m   Physical Exam Vitals and nursing note reviewed.  Constitutional:      General: She is not in acute distress.    Appearance: She is well-developed.  HENT:     Head: Normocephalic and atraumatic.     Mouth/Throat:     Comments: Mild erythema of  the posterior pharynx.  No edema.  No peritonsillar abscess.  Swallowing without difficulty.  No tonsillar exudate. Eyes:     Conjunctiva/sclera: Conjunctivae normal.  Cardiovascular:     Rate and Rhythm: Regular rhythm. Tachycardia present.     Heart sounds: No murmur heard. Pulmonary:     Effort: Pulmonary effort is normal. No respiratory distress.     Breath sounds: Normal breath sounds.     Comments: Diminished breath sounds in the bases bilaterally. Abdominal:     Palpations: Abdomen is soft.     Tenderness: There is no abdominal tenderness.  Musculoskeletal:        General: No swelling.     Cervical back: Neck supple.  Skin:    General: Skin is warm and dry.     Capillary Refill: Capillary refill takes less than 2 seconds.  Neurological:     Mental Status: She is alert.  Psychiatric:        Mood and Affect: Mood normal.     (all labs ordered are listed, but only abnormal results are displayed) Labs Reviewed  CBC WITH DIFFERENTIAL/PLATELET - Abnormal; Notable for the following components:      Result Value   Hemoglobin 11.6 (*)    HCT 35.9 (*)    MCV 75.1 (*)    MCH 24.3 (*)    All other components within normal limits  BASIC METABOLIC PANEL WITH GFR - Abnormal; Notable for the following components:   Glucose, Bld 122 (*)    All other components within normal limits  GROUP A STREP BY PCR  RESP PANEL BY RT-PCR (RSV, FLU A&B, COVID)  RVPGX2  LIPASE, BLOOD  D-DIMER, QUANTITATIVE  HCG, SERUM, QUALITATIVE  TROPONIN T, HIGH SENSITIVITY  TROPONIN T, HIGH SENSITIVITY    EKG: EKG Interpretation Date/Time:  Saturday December 19 2023 09:10:41 EST Ventricular Rate:  114 PR Interval:  124 QRS Duration:  72 QT Interval:  338 QTC Calculation: 466 R Axis:   57  Text Interpretation: Sinus tachycardia LAE, consider biatrial enlargement Confirmed by Yolande Charleston [phone]) on 12/19/2023 9:44:40 AM  Radiology: [facility] Chest 2 View Result Date: 12/19/2023 CLINICAL DATA:   Cough.  Chest pain. EXAM: CHEST - 2 VIEW COMPARISON:  04/16/2020. FINDINGS: Normal heart, mediastinum and hila. Clear lungs.  No pleural effusion or pneumothorax. Skeletal structures are within normal limits. IMPRESSION: Normal chest radiographs. Electronically Signed   By: Alm Parkins M.D.   On: 12/19/2023 10:52     Procedures   Medications Ordered in the ED  albuterol  (PROVENTIL ) (2.5 MG/3ML) 0.083% nebulizer solution (  Canceled Entry 12/19/23 0918)  albuterol  (PROVENTIL ) (2.5 MG/3ML) 0.083% nebulizer solution 2.5  mg (2.5 mg Nebulization Given 12/19/23 0916)  pantoprazole (PROTONIX) injection 40 mg (40 mg Intravenous Given 12/19/23 0953)  sodium chloride  0.9 % bolus 500 mL (0 mLs Intravenous Stopped 12/19/23 1103)                                    Medical Decision Making Amount and/or Complexity of Data Reviewed Labs: ordered. Radiology: ordered.  Risk Prescription drug management.     Differential diagnosis includes but is not limited to ACS, arrhythmia, aortic aneurysm, pericarditis, myocarditis, pericardial effusion, cardiac tamponade, musculoskeletal pain, GERD, Boerhaave's syndrome, DVT/PE, pneumonia, pleural effusion   ED Course:  Upon initial evaluation, patient is well-appearing, no acute distress.  Mild tachycardia to about 111 upon arrival. Patient with history of asthma and has slightly diminished lung sounds bilaterally.  Does not report any shortness of breath.  Talking in full sentences on room air without difficulty.  Maintaining 99% saturation.   Labs Ordered: I Ordered, and personally interpreted labs.  The pertinent results include:   Initial and repeat troponin within normal limits D-dimer within normal limit COVID, flu, RSV, strep negative Pregnancy negative CBC and CMP unremarkable  Imaging Studies ordered: I ordered imaging studies including chest x-ray I independently visualized the imaging with scope of interpretation limited to determining  acute life threatening conditions related to emergency care. Imaging showed no acute abnormalities I agree with the radiologist interpretation   Cardiac Monitoring: / EKG: The patient was maintained on a cardiac monitor.  I personally viewed and interpreted the cardiac monitored which showed an underlying rhythm of: Sinus tachycardia  Medications Given: 500 mL NS Protonix Albuterol    Upon re-evaluation, patient remains well-appearing with stable vitals.  Her tachycardia has resolved with the fluids given today.  Increased air movement in the lungs with albuterol  given.  Low concern for ACS at this time given troponin remains stable with initial troponin of less than 15 and repeat of less than 15, pain non-exertional, and EKG with normal sinus rhythm and no ST changes. Chest x-ray without any acute abnormality, no consolidations to suggest pneumonia. No concern for DVT or PE at this time given.  Her COVID, flu, RSV, strep testing is negative.  Given reassuring workup, suspect viral URI as the cause of her symptoms.  She is tolerating p.o. intake.  Well-appearing.  Stable and appropriate for discharge home this time    Impression: Viral URI  Disposition:  The patient was discharged home with instructions to use over-the-counter medications as needed for symptom control.  Follow-up with PCP if symptoms not improved within the next 5 days. Return precautions given and patient verbalized understanding.   This chart was dictated using voice recognition software, Dragon. Despite the best efforts of this provider to proofread and correct errors, errors may still occur which can change documentation meaning.       Final diagnoses:  Atypical chest pain  Viral URI    ED Discharge Orders     None          Veta Palma, [place] 12/19/23 1315    Yolande Lamar BROCKS, MD 12/20/23 1718

## 2023-12-19 NOTE — Discharge Instructions (Signed)
 You appear to have an upper respiratory infection (URI). An upper respiratory tract infection, or cold, is a viral infection of the air passages leading to the lungs. It should improve gradually after 5-7 days. You may have a lingering cough that lasts for 2- 4 weeks after the infection.  Your illness is contagious and can be spread to others. It cannot be cured by antibiotics or other medicines. Take basic precautions such as washing your hands often, covering your mouth when you cough or sneeze, and avoiding public places where you could spread your illness to others.   Your flu, covid, and RSV test were negative today.  Your strep test is negative.  Your chest x-ray did not show any pneumonia.  Your heart testing was normal, no signs of a heart attack or other abnormality in the heart.  Your labs did not show any evidence of a blood clot.  Home care instructions:  You can take Tylenol  and/or Ibuprofen as directed on the packaging for fever reduction and pain relief.    For cough: honey 1/2 to 1 teaspoon (you can dilute the honey in water or another fluid).  You can also use guaifenesin  and dextromethorphan  for cough which are over-the-counter medications. You can use a humidifier for chest congestion and cough.  If you don't have a humidifier, you can sit in the bathroom with the hot shower running.      For sore throat: try warm salt water gargles, cepacol lozenges, throat spray, warm tea or water with lemon/honey, popsicles or ice, or OTC cold relief medicine for throat discomfort.    For congestion: Flonase  (Fluticasone ) 1-2 sprays in each nostril daily. This is an over the counter medication.    It is important to stay hydrated: drink plenty of fluids (water, gatorade/powerade/pedialyte, juices, or teas) to keep your throat moisturized and help further relieve irritation/discomfort.   Follow-up instructions: Please follow-up with your primary care provider for further evaluation of your  symptoms if you are not feeling better within the next 5 days.   Return instructions:  Please return to the Emergency Department if you experience worsening symptoms.  RETURN IMMEDIATELY IF you develop shortness of breath, confusion or altered mental status, a new rash, become dizzy, faint, or poorly responsive, or are unable to be cared for at home. Please return if you have persistent vomiting and cannot keep down fluids or develop a fever that is not controlled by tylenol  or motrin.   Please return if you have any other emergent concerns.

## 2023-12-19 NOTE — ED Triage Notes (Signed)
 Present with epigastric pain and short of breath.  Cough for a week. Productive greenish.  Pain started this am constant.

## 2023-12-23 ENCOUNTER — Other Ambulatory Visit (HOSPITAL_BASED_OUTPATIENT_CLINIC_OR_DEPARTMENT_OTHER): Payer: Self-pay

## 2023-12-30 ENCOUNTER — Other Ambulatory Visit (HOSPITAL_BASED_OUTPATIENT_CLINIC_OR_DEPARTMENT_OTHER): Payer: Self-pay

## 2023-12-30 MED ORDER — LISDEXAMFETAMINE DIMESYLATE 40 MG PO CAPS
40.0000 mg | ORAL_CAPSULE | Freq: Every morning | ORAL | 0 refills | Status: AC
  Filled 2023-12-30 – 2024-01-06 (×2): qty 30, 30d supply, fill #0

## 2024-01-06 ENCOUNTER — Other Ambulatory Visit (HOSPITAL_BASED_OUTPATIENT_CLINIC_OR_DEPARTMENT_OTHER): Payer: Self-pay

## 2024-01-07 ENCOUNTER — Other Ambulatory Visit (HOSPITAL_BASED_OUTPATIENT_CLINIC_OR_DEPARTMENT_OTHER): Payer: Self-pay

## 2024-01-07 MED ORDER — VITAMIN D (ERGOCALCIFEROL) 1.25 MG (50000 UNIT) PO CAPS
50000.0000 [IU] | ORAL_CAPSULE | ORAL | 1 refills | Status: AC
  Filled 2024-01-07: qty 12, 84d supply, fill #0

## 2024-01-11 ENCOUNTER — Telehealth: Admitting: Physician Assistant

## 2024-01-11 DIAGNOSIS — J029 Acute pharyngitis, unspecified: Secondary | ICD-10-CM | POA: Diagnosis not present

## 2024-01-11 NOTE — Progress Notes (Signed)
 We are sorry that you are not feeling well.  Here is how we plan to help!  Your symptoms indicate a likely viral infection (Pharyngitis).   Pharyngitis is inflammation in the back of the throat which can cause a sore throat, scratchiness and sometimes difficulty swallowing.   Pharyngitis is typically caused by a respiratory virus and will just run its course.  Please keep in mind that your symptoms could last up to 10 days.    For throat pain, we recommend over the counter oral pain relief medications such as acetaminophen  or aspirin, or anti-inflammatory medications such as ibuprofen  or naproxen sodium.  Topical treatments such as oral throat lozenges or sprays may be used as needed.   Avoid close contact with loved ones, especially the very young and elderly.  Remember to wash your hands thoroughly throughout the day as this is the number one way to prevent the spread of infection! We also recommend that you periodically wipe down door knobs and counters with disinfectant.  After careful review of your answers, I would not recommend and antibiotic for your condition.  Antibiotics should not be used to treat conditions that we suspect are caused by viruses like the virus that causes the common cold or flu.  Providers prescribe antibiotics to treat infections caused by bacteria. Antibiotics are very powerful in treating bacterial infections when they are used properly.  To maintain their effectiveness, they should be used only when necessary.  Overuse of antibiotics has resulted in the development of super bugs that are resistant to treatment!    Some people with strep throat, however, can have atypical symptoms. As such, if anything continued to progress despite treatment recommendations, you may need formal testing in clinic or office.  Home Care: Only take medications as instructed by your medical team. Do not drink alcohol while taking these medications. A steam or ultrasonic humidifier can help  congestion.  You can place a towel over your head and breathe in the steam from hot water coming from a faucet. Avoid close contacts especially the very young and the elderly. Cover your mouth when you cough or sneeze. Always remember to wash your hands.  Get Help Right Away If: You develop worsening fever or throat pain. You develop a severe head ache or visual changes. Your symptoms persist after you have completed your treatment plan.  Make sure you Understand these instructions. Will watch your condition. Will get help right away if you are not doing well or get worse.  Your e-visit answers were reviewed by a board certified advanced clinical practitioner to complete your personal care plan.  Depending on the condition, your plan could have included both over the counter or prescription medications.  If there is a problem please reply once you have received a response from your provider.  Your safety is important to us .  If you have drug allergies check your prescription carefully.    You can use MyChart to ask questions about todays visit, request a non-urgent call back, or ask for a work or school excuse for 24 hours related to this e-Visit. If it has been greater than 24 hours you will need to follow up with your provider, or enter a new e-Visit to address those concerns.  You will get an e-mail in the next two days asking about your experience.  I hope that your e-visit has been valuable and will speed your recovery. Thank you for using e-visits.   I have spent 5 minutes  in review of e-visit questionnaire, review and updating patient chart, medical decision making and response to patient.   Elsie Velma Lunger, PA-C

## 2024-01-17 ENCOUNTER — Telehealth: Admitting: Physician Assistant

## 2024-01-17 DIAGNOSIS — B9689 Other specified bacterial agents as the cause of diseases classified elsewhere: Secondary | ICD-10-CM

## 2024-01-17 MED ORDER — AMOXICILLIN 500 MG PO CAPS: 500.0000 mg | ORAL_CAPSULE | Freq: Two times a day (BID) | ORAL | 0 refills | Status: AC

## 2024-01-17 NOTE — Progress Notes (Signed)

## 2024-02-02 ENCOUNTER — Other Ambulatory Visit (HOSPITAL_BASED_OUTPATIENT_CLINIC_OR_DEPARTMENT_OTHER): Payer: Self-pay

## 2024-02-02 ENCOUNTER — Other Ambulatory Visit: Payer: Self-pay

## 2024-02-03 ENCOUNTER — Other Ambulatory Visit (HOSPITAL_BASED_OUTPATIENT_CLINIC_OR_DEPARTMENT_OTHER): Payer: Self-pay

## 2024-02-04 ENCOUNTER — Other Ambulatory Visit (HOSPITAL_BASED_OUTPATIENT_CLINIC_OR_DEPARTMENT_OTHER): Payer: Self-pay

## 2024-02-04 MED ORDER — LISDEXAMFETAMINE DIMESYLATE 40 MG PO CAPS
40.0000 mg | ORAL_CAPSULE | Freq: Every day | ORAL | 0 refills | Status: AC
  Filled 2024-02-04: qty 30, 30d supply, fill #0

## 2024-02-04 MED ORDER — LISDEXAMFETAMINE DIMESYLATE 40 MG PO CAPS: 40.0000 mg | ORAL_CAPSULE | Freq: Every day | ORAL | 0 refills | Status: AC

## 2024-02-08 ENCOUNTER — Other Ambulatory Visit (HOSPITAL_BASED_OUTPATIENT_CLINIC_OR_DEPARTMENT_OTHER): Payer: Self-pay

## 2024-02-18 ENCOUNTER — Other Ambulatory Visit (HOSPITAL_COMMUNITY): Payer: Self-pay

## 2024-02-18 ENCOUNTER — Other Ambulatory Visit (HOSPITAL_BASED_OUTPATIENT_CLINIC_OR_DEPARTMENT_OTHER): Payer: Self-pay

## 2024-02-18 MED ORDER — HYDROXYZINE HCL 10 MG PO TABS
10.0000 mg | ORAL_TABLET | Freq: Every evening | ORAL | 0 refills | Status: AC
  Filled 2024-02-18 (×2): qty 60, 30d supply, fill #0

## 2024-02-19 ENCOUNTER — Other Ambulatory Visit: Payer: Self-pay

## 2024-02-19 ENCOUNTER — Other Ambulatory Visit (HOSPITAL_BASED_OUTPATIENT_CLINIC_OR_DEPARTMENT_OTHER): Payer: Self-pay

## 2024-04-18 ENCOUNTER — Encounter (HOSPITAL_BASED_OUTPATIENT_CLINIC_OR_DEPARTMENT_OTHER): Payer: Self-pay | Admitting: Obstetrics and Gynecology
